# Patient Record
Sex: Male | Born: 1955 | Race: Black or African American | Hispanic: No | Marital: Married | Smoking: Never smoker
Health system: Southern US, Community
[De-identification: ages and names within clinical notes are randomized; demographics above are authoritative.]

## PROBLEM LIST (undated history)

## (undated) DIAGNOSIS — N529 Male erectile dysfunction, unspecified: Secondary | ICD-10-CM

## (undated) DIAGNOSIS — I10 Essential (primary) hypertension: Secondary | ICD-10-CM

## (undated) DIAGNOSIS — E785 Hyperlipidemia, unspecified: Secondary | ICD-10-CM

## (undated) DIAGNOSIS — E119 Type 2 diabetes mellitus without complications: Secondary | ICD-10-CM

## (undated) DIAGNOSIS — G510 Bell's palsy: Secondary | ICD-10-CM

## (undated) HISTORY — DX: Bell's palsy: G51.0

## (undated) HISTORY — DX: Male erectile dysfunction, unspecified: N52.9

## (undated) HISTORY — PX: COLONOSCOPY: SHX174

## (undated) HISTORY — DX: Type 2 diabetes mellitus without complications: E11.9

## (undated) HISTORY — DX: Hyperlipidemia, unspecified: E78.5

---

## 2002-03-27 ENCOUNTER — Emergency Department (HOSPITAL_COMMUNITY): Admission: EM | Admit: 2002-03-27 | Discharge: 2002-03-27 | Payer: Self-pay | Admitting: Emergency Medicine

## 2005-02-26 ENCOUNTER — Emergency Department (HOSPITAL_COMMUNITY): Admission: EM | Admit: 2005-02-26 | Discharge: 2005-02-26 | Payer: Self-pay | Admitting: Family Medicine

## 2006-04-26 ENCOUNTER — Emergency Department (HOSPITAL_COMMUNITY): Admission: EM | Admit: 2006-04-26 | Discharge: 2006-04-26 | Payer: Self-pay | Admitting: Emergency Medicine

## 2006-07-17 ENCOUNTER — Ambulatory Visit (HOSPITAL_BASED_OUTPATIENT_CLINIC_OR_DEPARTMENT_OTHER): Admission: RE | Admit: 2006-07-17 | Discharge: 2006-07-17 | Payer: Self-pay | Admitting: Urology

## 2012-12-11 ENCOUNTER — Ambulatory Visit (INDEPENDENT_AMBULATORY_CARE_PROVIDER_SITE_OTHER): Payer: BC Managed Care – PPO | Admitting: Family Medicine

## 2012-12-11 VITALS — BP 164/82 | HR 71 | Temp 98.5°F | Resp 16 | Ht 71.5 in | Wt 214.6 lb

## 2012-12-11 DIAGNOSIS — R1011 Right upper quadrant pain: Secondary | ICD-10-CM

## 2012-12-11 DIAGNOSIS — Z0000 Encounter for general adult medical examination without abnormal findings: Secondary | ICD-10-CM

## 2012-12-11 LAB — LIPID PANEL
Cholesterol: 136 mg/dL (ref 0–200)
HDL: 52 mg/dL (ref >39)
LDL Cholesterol: 71 mg/dL (ref 0–99)
Total CHOL/HDL Ratio: 2.6 Ratio
Triglycerides: 63 mg/dL (ref <150)
VLDL: 13 mg/dL (ref 0–40)

## 2012-12-11 LAB — POCT CBC
Granulocyte percent: 61 %G (ref 37–80)
HCT, POC: 41.9 % — AB (ref 43.5–53.7)
Hemoglobin: 13 g/dL — AB (ref 14.1–18.1)
Lymph, poc: 1.5 (ref 0.6–3.4)
MCH, POC: 27.7 pg (ref 27–31.2)
MCHC: 31 g/dL — AB (ref 31.8–35.4)
MCV: 89.1 fL (ref 80–97)
MID (cbc): 0.4 (ref 0–0.9)
MPV: 7.5 fL (ref 0–99.8)
POC Granulocyte: 2.9 (ref 2–6.9)
POC LYMPH PERCENT: 31.5 %L (ref 10–50)
POC MID %: 7.5 %M (ref 0–12)
Platelet Count, POC: 335 10*3/uL (ref 142–424)
RBC: 4.7 M/uL (ref 4.69–6.13)
RDW, POC: 13.4 %
WBC: 4.7 10*3/uL (ref 4.6–10.2)

## 2012-12-11 LAB — POCT URINALYSIS DIPSTICK
Bilirubin, UA: NEGATIVE
Blood, UA: NEGATIVE
Glucose, UA: NEGATIVE
Ketones, UA: NEGATIVE
Leukocytes, UA: NEGATIVE
Nitrite, UA: NEGATIVE
Protein, UA: NEGATIVE
Spec Grav, UA: 1.025
Urobilinogen, UA: 0.2
pH, UA: 7

## 2012-12-11 LAB — COMPREHENSIVE METABOLIC PANEL
ALT: 18 U/L (ref 0–53)
AST: 14 U/L (ref 0–37)
Albumin: 4.4 g/dL (ref 3.5–5.2)
Alkaline Phosphatase: 80 U/L (ref 39–117)
BUN: 12 mg/dL (ref 6–23)
CO2: 26 mEq/L (ref 19–32)
Calcium: 9.7 mg/dL (ref 8.4–10.5)
Chloride: 104 mEq/L (ref 96–112)
Creat: 0.97 mg/dL (ref 0.50–1.35)
Glucose, Bld: 140 mg/dL — ABNORMAL HIGH (ref 70–99)
Potassium: 4.7 mEq/L (ref 3.5–5.3)
Sodium: 139 mEq/L (ref 135–145)
Total Bilirubin: 0.4 mg/dL (ref 0.3–1.2)
Total Protein: 6.9 g/dL (ref 6.0–8.3)

## 2012-12-11 LAB — PSA: PSA: 0.36 ng/mL (ref <=4.00)

## 2012-12-11 LAB — IFOBT (OCCULT BLOOD): IFOBT: NEGATIVE

## 2012-12-11 LAB — POCT GLYCOSYLATED HEMOGLOBIN (HGB A1C): Hemoglobin A1C: 5.9

## 2012-12-11 NOTE — Progress Notes (Signed)
Patient ID: Devin Novak MRN: [id number], DOB: May 19, 1956 57 y.o. Date of Encounter: 12/11/2012, 8:42 AM  Primary Physician: No primary provider on file.  Chief Complaint: Physical (CPE)  HPI: 57 y.o. y/o male with history noted below here for CPE.  This patient is married and has one son and 2 grandchildren. He works at [employer]. His wife works as a Electrical engineer. He has a history of diabetes. He notes some diaphoresis at night. He takes Chlamydia provided at night as well. Recently had eye exam. Colonoscopy 2009  Patient's been having some intermittent problems with vague abdominal pain several times a week in the right abdomen. He says is not a cramp but it's more of an ache. It has nothing to do with food however. Nausea or vomiting. Is also having no diarrhea.  Review of Systems: Consitutional: No fever, chills, fatigue, night sweats, lymphadenopathy, or weight changes. Eyes: No visual changes, eye redness, or discharge. ENT/Mouth: Ears: No otalgia, tinnitus, hearing loss, discharge. Nose: No congestion, rhinorrhea, sinus pain, or epistaxis. Throat: No sore throat, post nasal drip, or teeth pain. Cardiovascular: No CP, palpitations, diaphoresis, DOE, edema, orthopnea, PND. Respiratory: No cough, hemoptysis, SOB, or wheezing. Gastrointestinal: No anorexia, dysphagia, reflux,  nausea, vomiting, hematemesis, diarrhea, constipation, BRBPR, or melena. Genitourinary: No dysuria, frequency, urgency, hematuria, incontinence, nocturia, decreased urinary stream, discharge, impotence, or testicular pain/masses. Musculoskeletal: No decreased ROM, myalgias, stiffness, joint swelling, or weakness. Skin: No rash, erythema, lesion changes, pain, warmth, jaundice, or pruritis. Neurological: No headache, dizziness, syncope, seizures, tremors, memory loss, coordination problems, or paresthesias. Psychological: No anxiety, depression, hallucinations, SI/HI. Endocrine: No fatigue, polydipsia,  polyphagia, polyuria, or known diabetes. All other systems were reviewed and are otherwise negative.  No past medical history on file.   No past surgical history on file.  Home Meds:  Prior to Admission medications   Medication Sig Start Date End Date Taking? Authorizing Provider  atorvastatin (LIPITOR) 40 MG tablet Take 40 mg by mouth daily.   Yes Historical Provider, MD  glimepiride (AMARYL) 1 MG tablet Take 1 mg by mouth daily before breakfast.   Yes Historical Provider, MD  metFORMIN (GLUCOPHAGE) 500 MG tablet Take 500 mg by mouth 2 (two) times daily with a meal.   Yes Historical Provider, MD  ramipril (ALTACE) 5 MG tablet Take 5 mg by mouth daily.   Yes Historical Provider, MD    Allergies: No Known Allergies  History   Social History  . Marital Status: Single    Spouse Name: N/A    Number of Children: N/A  . Years of Education: N/A   Occupational History  . Not on file.   Social History Main Topics  . Smoking status: Never Smoker   . Smokeless tobacco: Not on file  . Alcohol Use: Not on file  . Drug Use: Not on file  . Sexually Active: Not on file   Other Topics Concern  . Not on file   Social History Narrative  . No narrative on file    No family history on file.  Physical Exam: Blood pressure 164/82, pulse 71, temperature 98.5 F (36.9 C), temperature source Oral, resp. rate 16, height 5' 11.5" (1.816 m), weight 214 lb 9.6 oz (97.342 kg), SpO2 99.00%.  General: Well developed, well nourished, in no acute distress. HEENT: Normocephalic, atraumatic. Conjunctiva pink, sclera non-icteric. Pupils 2 mm constricting to 1 mm, round, regular, and equally reactive to light and accomodation. EOMI. Internal auditory canal clear. TMs with good cone of light  and without pathology. Nasal mucosa pink. Nares are without discharge. No sinus tenderness. Oral mucosa pink. Dentition patient has an upper plate and no active disease in the dentition.Marland Kitchen Pharynx without exudate.      Neck: Supple. Trachea midline. No thyromegaly. Full ROM. No lymphadenopathy. Lungs: Clear to auscultation bilaterally without wheezes, rales, or rhonchi. Breathing is of normal effort and unlabored. Cardiovascular: RRR with S1 S2. No murmurs, rubs, or gallops appreciated. Distal pulses 2+ symmetrically. No carotid or abdominal bruits Abdomen: Soft, non-tender, non-distended with normoactive bowel sounds. No hepatosplenomegaly or masses. No rebound/guarding. No CVA tenderness. Without hernias.  Rectal: No external hemorrhoids or fissures. Rectal vault without masses.  Genitourinary:  uncircumcised male. No penile lesions. Testes descended bilaterally, and smooth without tenderness or masses.  Musculoskeletal: Full range of motion and 5/5 strength throughout. Without swelling, atrophy, tenderness, crepitus, or warmth. Extremities without clubbing, cyanosis, or edema. Calves supple. Skin: Warm and moist without erythema, ecchymosis, wounds, or rash. Neuro: A+Ox3. CN II-XII grossly intact. Moves all extremities spontaneously. Full sensation throughout. Normal gait. DTR 2+ throughout upper and lower extremities. Finger to nose intact. Psych:  Responds to questions appropriately with a normal affect.   Studies: CBC, CMET, Lipid, PSA pending UA:  Results for orders placed in visit on 12/11/12  POCT CBC      Result Value Range   WBC 4.7  4.6 - 10.2 K/uL   Lymph, poc 1.5  0.6 - 3.4   POC LYMPH PERCENT 31.5  10 - 50 %L   MID (cbc) 0.4  0 - 0.9   POC MID % 7.5  0 - 12 %M   POC Granulocyte 2.9  2 - 6.9   Granulocyte percent 61.0  37 - 80 %G   RBC 4.70  4.69 - 6.13 M/uL   Hemoglobin 13.0 (*) 14.1 - 18.1 g/dL   HCT, POC 16.1 (*) 09.6 - 53.7 %   MCV 89.1  80 - 97 fL   MCH, POC 27.7  27 - 31.2 pg   MCHC 31.0 (*) 31.8 - 35.4 g/dL   RDW, POC 04.5     Platelet Count, POC 335  142 - 424 K/uL   MPV 7.5  0 - 99.8 fL  POCT GLYCOSYLATED HEMOGLOBIN (HGB A1C)      Result Value Range   Hemoglobin A1C 5.9     POCT URINALYSIS DIPSTICK      Result Value Range   Color, UA yellow     Clarity, UA clear     Glucose, UA neg     Bilirubin, UA neg     Ketones, UA neg     Spec Grav, UA 1.025     Blood, UA neg     pH, UA 7.0     Protein, UA neg     Urobilinogen, UA 0.2     Nitrite, UA neg     Leukocytes, UA Negative    IFOBT (OCCULT BLOOD)      Result Value Range   IFOBT Negative       Assessment/Plan:  57 y.o. y/o  male here for CPE Routine general medical examination at a health care facility - Plan: POCT CBC, POCT glycosylated hemoglobin (Hb A1C), POCT urinalysis dipstick, Comprehensive metabolic panel, PSA, Lipid panel, EKG 12-Lead, US Abdomen Complete  Signed, Elvina Sidle, MD 12/11/2012 8:42 AM

## 2012-12-25 ENCOUNTER — Other Ambulatory Visit: Payer: Self-pay

## 2013-01-10 ENCOUNTER — Other Ambulatory Visit: Payer: Self-pay

## 2013-01-17 ENCOUNTER — Ambulatory Visit
Admission: RE | Admit: 2013-01-17 | Discharge: 2013-01-17 | Disposition: A | Discharge status: Home / Self Care | Payer: BC Managed Care – PPO | Source: Ambulatory Visit | Attending: Family Medicine | Admitting: Family Medicine

## 2013-01-17 DIAGNOSIS — Z0000 Encounter for general adult medical examination without abnormal findings: Secondary | ICD-10-CM

## 2013-06-11 ENCOUNTER — Ambulatory Visit
Admission: RE | Admit: 2013-06-11 | Discharge: 2013-06-11 | Disposition: A | Discharge status: Home / Self Care | Payer: BC Managed Care – PPO | Source: Ambulatory Visit | Attending: Family Medicine | Admitting: Family Medicine

## 2013-06-11 ENCOUNTER — Other Ambulatory Visit: Payer: Self-pay | Admitting: Family Medicine

## 2013-06-11 DIAGNOSIS — R634 Abnormal weight loss: Secondary | ICD-10-CM

## 2015-06-04 ENCOUNTER — Ambulatory Visit (INDEPENDENT_AMBULATORY_CARE_PROVIDER_SITE_OTHER): Payer: Self-pay | Admitting: Family Medicine

## 2015-06-04 VITALS — BP 118/74 | HR 96 | Temp 97.6°F | Resp 16 | Ht 71.5 in | Wt 212.2 lb

## 2015-06-04 DIAGNOSIS — E785 Hyperlipidemia, unspecified: Secondary | ICD-10-CM | POA: Insufficient documentation

## 2015-06-04 DIAGNOSIS — E1165 Type 2 diabetes mellitus with hyperglycemia: Secondary | ICD-10-CM

## 2015-06-04 DIAGNOSIS — IMO0001 Reserved for inherently not codable concepts without codable children: Secondary | ICD-10-CM

## 2015-06-04 DIAGNOSIS — Z794 Long term (current) use of insulin: Secondary | ICD-10-CM

## 2015-06-04 DIAGNOSIS — E119 Type 2 diabetes mellitus without complications: Secondary | ICD-10-CM | POA: Insufficient documentation

## 2015-06-04 LAB — COMPREHENSIVE METABOLIC PANEL
ALT: 13 U/L (ref 9–46)
AST: 11 U/L (ref 10–35)
Albumin: 4.5 g/dL (ref 3.6–5.1)
Alkaline Phosphatase: 109 U/L (ref 40–115)
BUN: 17 mg/dL (ref 7–25)
CHLORIDE: 97 mmol/L — AB (ref 98–110)
CO2: 25 mmol/L (ref 20–31)
CREATININE: 1.1 mg/dL (ref 0.70–1.33)
Calcium: 9.4 mg/dL (ref 8.6–10.3)
GLUCOSE: 323 mg/dL — AB (ref 65–99)
POTASSIUM: 4.6 mmol/L (ref 3.5–5.3)
SODIUM: 132 mmol/L — AB (ref 135–146)
TOTAL PROTEIN: 7.2 g/dL (ref 6.1–8.1)
Total Bilirubin: 0.5 mg/dL (ref 0.2–1.2)

## 2015-06-04 LAB — LIPID PANEL
CHOL/HDL RATIO: 3.7 ratio (ref <=5.0)
CHOLESTEROL: 134 mg/dL (ref 125–200)
HDL: 36 mg/dL — ABNORMAL LOW (ref >=40)
LDL CALC: 71 mg/dL (ref <130)
Triglycerides: 134 mg/dL (ref <150)
VLDL: 27 mg/dL (ref <30)

## 2015-06-04 LAB — GLUCOSE, POCT (MANUAL RESULT ENTRY): POC GLUCOSE: 337 mg/dL — AB (ref 70–99)

## 2015-06-04 LAB — POCT GLYCOSYLATED HEMOGLOBIN (HGB A1C): HEMOGLOBIN A1C: 11.7

## 2015-06-04 MED ORDER — GLIMEPIRIDE 1 MG PO TABS
1.0000 mg | ORAL_TABLET | Freq: Every day | ORAL | Status: DC
Start: 2015-06-04 — End: 2015-06-11

## 2015-06-04 MED ORDER — INSULIN GLARGINE 100 UNIT/ML ~~LOC~~ SOLN
10.0000 [IU] | Freq: Once | SUBCUTANEOUS | Status: AC
  Administered 2015-06-04: 10 [IU] via SUBCUTANEOUS

## 2015-06-04 MED ORDER — METFORMIN HCL 1000 MG PO TABS: 1000.0000 mg | ORAL_TABLET | Freq: Two times a day (BID) | ORAL | Status: DC

## 2015-06-04 MED ORDER — RAMIPRIL 5 MG PO CAPS: 5.0000 mg | ORAL_CAPSULE | Freq: Every day | ORAL | Status: DC

## 2015-06-04 NOTE — Progress Notes (Signed)
[facility] [address], [place] [id number] [phone]-[phone]  Date:  06/04/2015   Name:  Devin Novak   DOB:  03-Feb-1956   MRN:  [id number]  PCP:  No primary care provider on file.    Chief Complaint: Blood Sugar Problem and Flu Vaccine   History of Present Illness:  Devin Novak is a 59 y.o. very pleasant male patient who presents with the following:  History of DM- last seen here in 2014. He has been taking metformin, atrovastatin and ramipril but not the amaryl recently He is taking metformin 500 #3 daily.    He is currently in-between jobs and does not have insurnace.  His last A1c was over a year ago  His sugars have been over 300 for a couple of months. He has been feeling tired, thirsty, increased urination.  He may have lost a few lbs but not many.  He is not acutely ill but thought he should get looked at.  He is fasting today  Lab Results  Component Value Date   HGBA1C 5.9 12/11/2012     There are no active problems to display for this patient.   Past Medical History  Diagnosis Date  . Diabetes mellitus without complication (HCC)     History reviewed. No pertinent past surgical history.  Social History  Substance Use Topics  . Smoking status: Never Smoker   . Smokeless tobacco: None  . Alcohol Use: None    Family History  Problem Relation Age of Onset  . Cancer Mother   . Diabetes Mother     No Known Allergies  Medication list has been reviewed and updated.  Current Outpatient Prescriptions on File Prior to Visit  Medication Sig Dispense Refill  . atorvastatin (LIPITOR) 40 MG tablet Take 40 mg by mouth daily.    Marland Kitchen. glimepiride (AMARYL) 1 MG tablet Take 1 mg by mouth daily before breakfast.    . metFORMIN (GLUCOPHAGE) 500 MG tablet Take 500 mg by mouth 2 (two) times daily with a meal.    . ramipril (ALTACE) 5 MG tablet Take 5 mg by mouth daily.     No current facility-administered medications on file prior to  visit.    Review of Systems:  As per HPI- otherwise negative.   Physical Examination: Filed Vitals:   06/04/15 0819  BP: 118/74  Pulse: 96  Temp: 97.6 F (36.4 C)  Resp: 16   Filed Vitals:   06/04/15 0819  Height: 5' 11.5" (1.816 m)  Weight: 212 lb 3.2 oz (96.253 kg)   Body mass index is 29.19 kg/(m^2). Ideal Body Weight: Weight in (lb) to have BMI = 25: 181.4  GEN: WDWN, NAD, Non-toxic, A & O x 3, overweight, looks well HEENT: Atraumatic, Normocephalic. Neck supple. No masses, No LAD. Ears and Nose: No external deformity. CV: RRR, No M/G/R. No JVD. No thrill. No extra heart sounds. PULM: CTA B, no wheezes, crackles, rhonchi. No retractions. No resp. distress. No accessory muscle use. EXTR: No c/c/e NEURO Normal gait.  PSYCH: Normally interactive. Conversant. Not depressed or anxious appearing.  Calm demeanor.   Results for orders placed or performed in visit on 06/04/15  Comprehensive metabolic panel  Result Value Ref Range   Sodium 132 (L) 135 - 146 mmol/L   Potassium 4.6 3.5 - 5.3 mmol/L   Chloride 97 (L) 98 - 110 mmol/L   CO2 25 20 - 31 mmol/L   Glucose, Bld 323 (H) 65 - 99 mg/dL  BUN 17 7 - 25 mg/dL   Creat 1.61 0.96 - 0.45 mg/dL   Total Bilirubin 0.5 0.2 - 1.2 mg/dL   Alkaline Phosphatase 109 40 - 115 U/L   AST 11 10 - 35 U/L   ALT 13 9 - 46 U/L   Total Protein 7.2 6.1 - 8.1 g/dL   Albumin 4.5 3.6 - 5.1 g/dL   Calcium 9.4 8.6 - 40.9 mg/dL  Lipid panel  Result Value Ref Range   Cholesterol 134 125 - 200 mg/dL   Triglycerides 811 <914 mg/dL   HDL 36 (L) >=78 mg/dL   Total CHOL/HDL Ratio 3.7 <=5.0 Ratio   VLDL 27 <30 mg/dL   LDL Cholesterol 71 <295 mg/dL  POCT glucose (manual entry)  Result Value Ref Range   POC Glucose 337 (A) 70 - 99 mg/dl  POCT glycosylated hemoglobin (Hb A1C)  Result Value Ref Range   Hemoglobin A1C 11.7      Assessment and Plan: Uncontrolled type 2 diabetes mellitus without complication, with long-term current use of  insulin (HCC) - Plan: POCT glucose (manual entry), POCT glycosylated hemoglobin (Hb A1C), Comprehensive metabolic panel, Lipid panel, insulin glargine (LANTUS) injection 10 Units, metFORMIN (GLUCOPHAGE) 1000 MG tablet, glimepiride (AMARYL) 1 MG tablet  Dyslipidemia  Here today to discuss diabetes and get back on his medications. Noted elevated A1c- will increase his metformin to 2,000 daily and also added back his amaryl.  Gave 10u of lantus here and a solostar pen to take home, did teaching with pt.    Received the rest of his labs- Na:135.6 Anion gap: 10  He does not show signs of acidosis.  Called to let him know.  Increase oral meds as above and will check in with him in a couple of days.  If glucose towards 400 he will call me    Signed Abbe Amsterdam, MD

## 2015-06-04 NOTE — Patient Instructions (Signed)
Your diabetes has gotten quite out of control We are going to increase your metformin to 1,000 mg twice a day and also restart your glimepiride 1mg  a day.  You got 10 units of insulin today and a pen to take home in case we need to use this for a while Work on drinking plenty of water and watch your sugar/ carb intake.  Please check out the american diabetes association website for lots of good information I will call you with your labs asap- please see us in one week to check in

## 2015-06-05 ENCOUNTER — Encounter: Payer: Self-pay | Admitting: Family Medicine

## 2015-06-11 ENCOUNTER — Telehealth: Payer: Self-pay | Admitting: Family Medicine

## 2015-06-11 DIAGNOSIS — IMO0001 Reserved for inherently not codable concepts without codable children: Secondary | ICD-10-CM

## 2015-06-11 DIAGNOSIS — E1165 Type 2 diabetes mellitus with hyperglycemia: Principal | ICD-10-CM

## 2015-06-11 DIAGNOSIS — Z794 Long term (current) use of insulin: Principal | ICD-10-CM

## 2015-06-11 MED ORDER — GLIMEPIRIDE 2 MG PO TABS: 2.0000 mg | ORAL_TABLET | Freq: Every day | ORAL | Status: DC

## 2015-06-11 NOTE — Telephone Encounter (Signed)
Called to check on him- he reports that his sugars are running about 250 which is better.  We will increase his glimepiride to 2 mg and he will come in for a recheck in 2-3 weeks

## 2015-07-13 ENCOUNTER — Telehealth: Payer: Self-pay

## 2015-07-13 NOTE — Telephone Encounter (Signed)
Pt is needing a refill on his cholesteral medication he could not remember the name but it starts with an A and is needing to make ramaphil in generic  Best number [phone]

## 2015-07-13 NOTE — Telephone Encounter (Signed)
Rampril is generic.

## 2015-07-14 MED ORDER — ATORVASTATIN CALCIUM 40 MG PO TABS: 40.0000 mg | ORAL_TABLET | Freq: Every day | ORAL | Status: DC

## 2015-07-14 NOTE — Telephone Encounter (Signed)
Sent in Rx for atorvastatin, and Dr Patsy Lager had sent in Ramipril (generic) #90 w/ 3 RFs on 06/04/15 to [facility] on [address]. Please notify pt and see if he needs anything else.

## 2015-07-14 NOTE — Telephone Encounter (Signed)
Patient called back and relayed the information to patient.  He will pick up scripts at [facility].

## 2015-07-14 NOTE — Telephone Encounter (Signed)
Unable to reach Pt. Left VM to call back, please see previous phone message

## 2015-08-03 ENCOUNTER — Telehealth: Payer: Self-pay

## 2015-08-03 NOTE — Telephone Encounter (Signed)
Pt is needing to talk with someone about refilling his ramipril

## 2015-08-04 ENCOUNTER — Other Ambulatory Visit: Payer: Self-pay | Admitting: *Deleted

## 2015-08-04 DIAGNOSIS — E1165 Type 2 diabetes mellitus with hyperglycemia: Principal | ICD-10-CM

## 2015-08-04 DIAGNOSIS — IMO0001 Reserved for inherently not codable concepts without codable children: Secondary | ICD-10-CM

## 2015-08-04 DIAGNOSIS — Z794 Long term (current) use of insulin: Principal | ICD-10-CM

## 2015-08-04 MED ORDER — RAMIPRIL 5 MG PO CAPS: 5.0000 mg | ORAL_CAPSULE | Freq: Every day | ORAL | Status: DC

## 2015-08-04 NOTE — Telephone Encounter (Signed)
Follow up appointment needed for refills

## 2015-10-16 ENCOUNTER — Other Ambulatory Visit: Payer: Self-pay | Admitting: Family Medicine

## 2015-10-22 ENCOUNTER — Ambulatory Visit (INDEPENDENT_AMBULATORY_CARE_PROVIDER_SITE_OTHER): Payer: Commercial Managed Care - PPO | Admitting: Family Medicine

## 2015-10-22 VITALS — BP 122/80 | HR 74 | Temp 98.2°F | Resp 18 | Ht 71.5 in | Wt 215.0 lb

## 2015-10-22 DIAGNOSIS — R6882 Decreased libido: Secondary | ICD-10-CM | POA: Diagnosis not present

## 2015-10-22 DIAGNOSIS — E119 Type 2 diabetes mellitus without complications: Secondary | ICD-10-CM | POA: Diagnosis not present

## 2015-10-22 DIAGNOSIS — G4700 Insomnia, unspecified: Secondary | ICD-10-CM

## 2015-10-22 DIAGNOSIS — E7800 Pure hypercholesterolemia, unspecified: Secondary | ICD-10-CM

## 2015-10-22 DIAGNOSIS — E1165 Type 2 diabetes mellitus with hyperglycemia: Secondary | ICD-10-CM

## 2015-10-22 DIAGNOSIS — Z794 Long term (current) use of insulin: Secondary | ICD-10-CM

## 2015-10-22 DIAGNOSIS — Z1159 Encounter for screening for other viral diseases: Secondary | ICD-10-CM

## 2015-10-22 DIAGNOSIS — IMO0001 Reserved for inherently not codable concepts without codable children: Secondary | ICD-10-CM

## 2015-10-22 DIAGNOSIS — Z23 Encounter for immunization: Secondary | ICD-10-CM | POA: Diagnosis not present

## 2015-10-22 LAB — TSH: TSH: 2.01 m[IU]/L (ref 0.40–4.50)

## 2015-10-22 LAB — CBC WITH DIFFERENTIAL/PLATELET
BASOS PCT: 0 %
Basophils Absolute: 0 cells/uL (ref 0–200)
EOS PCT: 3 %
Eosinophils Absolute: 201 cells/uL (ref 15–500)
HEMATOCRIT: 41.9 % (ref 38.5–50.0)
Hemoglobin: 13.5 g/dL (ref 13.2–17.1)
LYMPHS PCT: 30 %
Lymphs Abs: 2010 cells/uL (ref 850–3900)
MCH: 28.6 pg (ref 27.0–33.0)
MCHC: 32.2 g/dL (ref 32.0–36.0)
MCV: 88.8 fL (ref 80.0–100.0)
MONOS PCT: 7 %
MPV: 9.2 fL (ref 7.5–12.5)
Monocytes Absolute: 469 cells/uL (ref 200–950)
NEUTROS PCT: 60 %
Neutro Abs: 4020 cells/uL (ref 1500–7800)
PLATELETS: 420 10*3/uL — AB (ref 140–400)
RBC: 4.72 MIL/uL (ref 4.20–5.80)
RDW: 13.5 % (ref 11.0–15.0)
WBC: 6.7 10*3/uL (ref 3.8–10.8)

## 2015-10-22 LAB — COMPREHENSIVE METABOLIC PANEL
ALT: 15 U/L (ref 9–46)
AST: 13 U/L (ref 10–35)
Albumin: 4.3 g/dL (ref 3.6–5.1)
Alkaline Phosphatase: 103 U/L (ref 40–115)
BILIRUBIN TOTAL: 0.5 mg/dL (ref 0.2–1.2)
BUN: 12 mg/dL (ref 7–25)
CALCIUM: 9.3 mg/dL (ref 8.6–10.3)
CO2: 26 mmol/L (ref 20–31)
Chloride: 104 mmol/L (ref 98–110)
Creat: 1.08 mg/dL (ref 0.70–1.33)
GLUCOSE: 134 mg/dL — AB (ref 65–99)
Potassium: 4.7 mmol/L (ref 3.5–5.3)
SODIUM: 141 mmol/L (ref 135–146)
Total Protein: 7.1 g/dL (ref 6.1–8.1)

## 2015-10-22 LAB — MICROALBUMIN, URINE: MICROALB UR: 1.2 mg/dL

## 2015-10-22 LAB — POCT URINALYSIS DIP (MANUAL ENTRY)
Bilirubin, UA: NEGATIVE
GLUCOSE UA: NEGATIVE
Ketones, POC UA: NEGATIVE
Leukocytes, UA: NEGATIVE
NITRITE UA: NEGATIVE
PH UA: 5
PROTEIN UA: NEGATIVE
RBC UA: NEGATIVE
SPEC GRAV UA: 1.02
UROBILINOGEN UA: 0.2

## 2015-10-22 LAB — LIPID PANEL
Cholesterol: 110 mg/dL — ABNORMAL LOW (ref 125–200)
HDL: 49 mg/dL (ref >=40)
LDL CALC: 46 mg/dL (ref <130)
Total CHOL/HDL Ratio: 2.2 Ratio (ref <=5.0)
Triglycerides: 76 mg/dL (ref <150)
VLDL: 15 mg/dL (ref <30)

## 2015-10-22 LAB — GLUCOSE, POCT (MANUAL RESULT ENTRY): POC GLUCOSE: 152 mg/dL — AB (ref 70–99)

## 2015-10-22 LAB — POCT GLYCOSYLATED HEMOGLOBIN (HGB A1C): Hemoglobin A1C: 6.6

## 2015-10-22 MED ORDER — METFORMIN HCL 1000 MG PO TABS: 1000.0000 mg | ORAL_TABLET | Freq: Two times a day (BID) | ORAL | Status: DC

## 2015-10-22 MED ORDER — RAMIPRIL 5 MG PO CAPS: 5.0000 mg | ORAL_CAPSULE | Freq: Every day | ORAL | Status: DC

## 2015-10-22 MED ORDER — ATORVASTATIN CALCIUM 40 MG PO TABS: 40.0000 mg | ORAL_TABLET | Freq: Every day | ORAL | Status: DC

## 2015-10-22 MED ORDER — GLIMEPIRIDE 2 MG PO TABS: ORAL_TABLET | ORAL | Status: DC

## 2015-10-22 NOTE — Progress Notes (Addendum)
Subjective:    Patient ID: Devin Novak, male    DOB: Aug 24, 1955, 60 y.o.   MRN: [id number]  10/22/2015  Follow-up; Diabetes; Medication Refill; and Insomnia   HPI This 60 y.o. male presents for five month follow-up for the following:   1.  DMII: meter broke; not checking sugars for past month; sugars running 220 fasting.  Taking Metformin 1000mg  bid.  Taking Glimepiride 2mg  one at bedtime.  Has lost a lot of weight; did weigh 240.   Last eye exam this month; [facility].    2.  Dyslipidemia: taking Lipitor 40mg  qhs.  3.  HTN: taking Ramipiril qhs.    4. Mother with colon cancer: last colonoscopy seven years ago.  [facility].    5. Decreased libido:  Onset in past year; marriage is good.  No problems with erections. Wife worried about another relationship.  No desire; prefers to sit on couch and watch television.  Tired.  Sleeping 5-6 hours per night; wakes up 2 hours prior to time to wake up.  No snoring.  No apnea.   PCP: [facility] over one year ago.     Review of Systems  Constitutional: Negative for fever, chills, diaphoresis, activity change, appetite change and fatigue.  Respiratory: Negative for cough and shortness of breath.   Cardiovascular: Negative for chest pain, palpitations and leg swelling.  Gastrointestinal: Negative for nausea, vomiting, abdominal pain and diarrhea.  Endocrine: Negative for cold intolerance, heat intolerance, polydipsia, polyphagia and polyuria.  Skin: Negative for color change, rash and wound.  Neurological: Negative for dizziness, tremors, seizures, syncope, facial asymmetry, speech difficulty, weakness, light-headedness, numbness and headaches.  Psychiatric/Behavioral: Negative for sleep disturbance and dysphoric mood. The patient is not nervous/anxious.     Past Medical History  Diagnosis Date  . Diabetes mellitus without complication (HCC)   . Hyperlipidemia    History reviewed. No pertinent past surgical history. No Known  Allergies Current Outpatient Prescriptions  Medication Sig Dispense Refill  . atorvastatin (LIPITOR) 40 MG tablet Take 1 tablet (40 mg total) by mouth daily. 90 tablet 1  . glimepiride (AMARYL) 2 MG tablet TAKE ONE TABLET BY MOUTH ONCE DAILY BEFORE BREAKFAST 90 tablet 1  . metFORMIN (GLUCOPHAGE) 1000 MG tablet Take 1 tablet (1,000 mg total) by mouth 2 (two) times daily with a meal. 180 tablet 1  . ramipril (ALTACE) 5 MG capsule Take 1 capsule (5 mg total) by mouth daily. 90 capsule 1   No current facility-administered medications for this visit.   Social History   Social History  . Marital Status: Single    Spouse Name: N/A  . Number of Children: N/A  . Years of Education: N/A   Occupational History  . Not on file.   Social History Main Topics  . Smoking status: Never Smoker   . Smokeless tobacco: Current User    Types: Chew  . Alcohol Use: Not on file  . Drug Use: Not on file  . Sexual Activity: Not on file   Other Topics Concern  . Not on file   Social History Narrative   Marital status: married x 3 years     Children:  1 son; 4 grandchildren in area      Lives: with wife      Employment:  Banker; worked for [employer] for 30 years      Tobacco:  None      Alcohol: beer 2 per day      Exercise:  none  Family History  Problem Relation Age of Onset  . Cancer Mother 1655    colon cancer  . Diabetes Mother        Objective:    BP 122/80 mmHg  Pulse 74  Temp(Src) 98.2 F (36.8 C) (Oral)  Resp 18  Ht 5' 11.5" (1.816 m)  Wt 215 lb (97.523 kg)  BMI 29.57 kg/m2  SpO2 98% Physical Exam  Constitutional: He is oriented to person, place, and time. He appears well-developed and well-nourished. No distress.  HENT:  Head: Normocephalic and atraumatic.  Right Ear: External ear normal.  Left Ear: External ear normal.  Nose: Nose normal.  Mouth/Throat: Oropharynx is clear and moist.  Eyes: Conjunctivae and EOM are normal. Pupils are equal, round,  and reactive to light.  Neck: Normal range of motion. Neck supple. Carotid bruit is not present. No thyromegaly present.  Cardiovascular: Normal rate, regular rhythm, normal heart sounds and intact distal pulses.  Exam reveals no gallop and no friction rub.   No murmur heard. Pulmonary/Chest: Effort normal and breath sounds normal. He has no wheezes. He has no rales.  Abdominal: Soft. Bowel sounds are normal. He exhibits no distension and no mass. There is no tenderness. There is no rebound and no guarding.  Lymphadenopathy:    He has no cervical adenopathy.  Neurological: He is alert and oriented to person, place, and time. No cranial nerve deficit.  Skin: Skin is warm and dry. No rash noted. He is not diaphoretic.  Calluses B feet.  Psychiatric: He has a normal mood and affect. His behavior is normal.  Nursing note and vitals reviewed.  Results for orders placed or performed in visit on 10/22/15  POCT glucose (manual entry)  Result Value Ref Range   POC Glucose 152 (A) 70 - 99 mg/dl  POCT glycosylated hemoglobin (Hb A1C)  Result Value Ref Range   Hemoglobin A1C 6.6   POCT urinalysis dipstick  Result Value Ref Range   Color, UA yellow yellow   Clarity, UA clear clear   Glucose, UA negative negative   Bilirubin, UA negative negative   Ketones, POC UA negative negative   Spec Grav, UA 1.020    Blood, UA negative negative   pH, UA 5.0    Protein Ur, POC negative negative   Urobilinogen, UA 0.2    Nitrite, UA Negative Negative   Leukocytes, UA Negative Negative       Assessment & Plan:   1. Type 2 diabetes mellitus without complication, without long-term current use of insulin (HCC)   2. Pure hypercholesterolemia   3. Need for Tdap vaccination   4. Need for prophylactic vaccination against Streptococcus pneumoniae (pneumococcus)   5. Need for hepatitis C screening test   6. Insomnia   7. Decreased libido   8. Uncontrolled type 2 diabetes mellitus without complication, with  long-term current use of insulin (HCC)    -Controlled. -refills provided. -obtain labs. -RTC 3-6 months. -s/p TDAP and Pneumovax. -pt declined referral for repeat colonoscopy; to [facility]. -obtain testosterone level due to decreased libido. -consider sleep aide such as Melatonin and Unisom.  If no improvement in fatigue, consider sleep study.   Orders Placed This Encounter  Procedures  . Tdap vaccine greater than or equal to 7yo IM  . Pneumococcal polysaccharide vaccine 23-valent greater than or equal to 2yo subcutaneous/IM  . CBC with Differential/Platelet  . Comprehensive metabolic panel    Order Specific Question:  Has the patient fasted?  Answer:  Yes  . Lipid panel    Order Specific Question:  Has the patient fasted?    Answer:  Yes  . TSH  . Microalbumin, urine  . Hepatitis C antibody  . Testosterone  . POCT glucose (manual entry)  . POCT glycosylated hemoglobin (Hb A1C)  . POCT urinalysis dipstick   Meds ordered this encounter  Medications  . ramipril (ALTACE) 5 MG capsule    Sig: Take 1 capsule (5 mg total) by mouth daily.    Dispense:  90 capsule    Refill:  1  . metFORMIN (GLUCOPHAGE) 1000 MG tablet    Sig: Take 1 tablet (1,000 mg total) by mouth 2 (two) times daily with a meal.    Dispense:  180 tablet    Refill:  1  . glimepiride (AMARYL) 2 MG tablet    Sig: TAKE ONE TABLET BY MOUTH ONCE DAILY BEFORE BREAKFAST    Dispense:  90 tablet    Refill:  1  . atorvastatin (LIPITOR) 40 MG tablet    Sig: Take 1 tablet (40 mg total) by mouth daily.    Dispense:  90 tablet    Refill:  1    Return in about 6 months (around 04/23/2016) for recheck diabetes, high cholesterol.    Nyia Tsao Paulita Fujita, M.D. [facility] [address] [place], [place]  [address] [phone] phone [phone] fax

## 2015-10-22 NOTE — Patient Instructions (Addendum)
1.  START ASPIRIN 81MG  ONE TABLET DAILY FOR HEART ATTACK AND STROKE PREVENTION.   Basic Carbohydrate Counting for Diabetes Mellitus Carbohydrate counting is a method for keeping track of the amount of carbohydrates you eat. Eating carbohydrates naturally increases the level of sugar (glucose) in your blood, so it is important for you to know the amount that is okay for you to have in every meal. Carbohydrate counting helps keep the level of glucose in your blood within normal limits. The amount of carbohydrates allowed is different for every person. A dietitian can help you calculate the amount that is right for you. Once you know the amount of carbohydrates you can have, you can count the carbohydrates in the foods you want to eat. Carbohydrates are found in the following foods:  Grains, such as breads and cereals.  Dried beans and soy products.  Starchy vegetables, such as potatoes, peas, and corn.  Fruit and fruit juices.  Milk and yogurt.  Sweets and snack foods, such as cake, cookies, candy, chips, soft drinks, and fruit drinks. CARBOHYDRATE COUNTING There are two ways to count the carbohydrates in your food. You can use either of the methods or a combination of both. Reading the "Nutrition Facts" on Packaged Food The "Nutrition Facts" is an area that is included on the labels of almost all packaged food and beverages in the [place]. It includes the serving size of that food or beverage and information about the nutrients in each serving of the food, including the grams (g) of carbohydrate per serving.  Decide the number of servings of this food or beverage that you will be able to eat or drink. Multiply that number of servings by the number of grams of carbohydrate that is listed on the label for that serving. The total will be the amount of carbohydrates you will be having when you eat or drink this food or beverage. Learning Standard Serving Sizes of Food When you eat food that  is not packaged or does not include "Nutrition Facts" on the label, you need to measure the servings in order to count the amount of carbohydrates.A serving of most carbohydrate-rich foods contains about 15 g of carbohydrates. The following list includes serving sizes of carbohydrate-rich foods that provide 15 g ofcarbohydrate per serving:   1 slice of bread (1 oz) or 1 six-inch tortilla.    of a hamburger bun or English muffin.  4-6 crackers.   cup unsweetened dry cereal.    cup hot cereal.   cup rice or pasta.    cup mashed potatoes or  of a large baked potato.  1 cup fresh fruit or one small piece of fruit.    cup canned or frozen fruit or fruit juice.  1 cup milk.   cup plain fat-free yogurt or yogurt sweetened with artificial sweeteners.   cup cooked dried beans or starchy vegetable, such as peas, corn, or potatoes.  Decide the number of standard-size servings that you will eat. Multiply that number of servings by 15 (the grams of carbohydrates in that serving). For example, if you eat 2 cups of strawberries, you will have eaten 2 servings and 30 g of carbohydrates (2 servings x 15 g = 30 g). For foods such as soups and casseroles, in which more than one food is mixed in, you will need to count the carbohydrates in each food that is included. EXAMPLE OF CARBOHYDRATE COUNTING Sample Dinner  3 oz chicken breast.   cup of brown  rice.   cup of corn.  1 cup milk.   1 cup strawberries with sugar-free whipped topping.  Carbohydrate Calculation Step 1: Identify the foods that contain carbohydrates:   Rice.   Corn.   Milk.   Strawberries. Step 2:Calculate the number of servings eaten of each:   2 servings of rice.   1 serving of corn.   1 serving of milk.   1 serving of strawberries. Step 3: Multiply each of those number of servings by 15 g:   2 servings of rice x 15 g = 30 g.   1 serving of corn x 15 g = 15 g.   1 serving of  milk x 15 g = 15 g.   1 serving of strawberries x 15 g = 15 g. Step 4: Add together all of the amounts to find the total grams of carbohydrates eaten: 30 g + 15 g + 15 g + 15 g = 75 g.   This information is not intended to replace advice given to you by your health care provider. Make sure you discuss any questions you have with your health care provider.   Document Released: 05/30/2005 Document Revised: 06/20/2014 Document Reviewed: 04/26/2013 Elsevier Interactive Patient Education 2016 [employer].     IF you received an x-ray today, you will receive an invoice from [facility]. Please contact [facility] at [phone] with questions or concerns regarding your invoice.   IF you received labwork today, you will receive an invoice from [facility]. Please contact [facility] at [phone] with questions or concerns regarding your invoice.   Our billing staff will not be able to assist you with questions regarding bills from these companies.  You will be contacted with the lab results as soon as they are available. The fastest way to get your results is to activate your My Chart account. Instructions are located on the last page of this paperwork. If you have not heard from [place] regarding the results in 2 weeks, please contact this office.

## 2015-10-23 LAB — HEPATITIS C ANTIBODY: HCV Ab: NEGATIVE

## 2015-10-23 LAB — TESTOSTERONE: Testosterone: 264 ng/dL (ref 250–827)

## 2015-11-15 ENCOUNTER — Encounter: Payer: Self-pay | Admitting: Family Medicine

## 2016-02-04 ENCOUNTER — Ambulatory Visit (INDEPENDENT_AMBULATORY_CARE_PROVIDER_SITE_OTHER): Payer: Commercial Managed Care - PPO | Admitting: Physician Assistant

## 2016-02-04 VITALS — BP 130/76 | HR 70 | Temp 98.0°F | Resp 18 | Ht 71.25 in | Wt 210.2 lb

## 2016-02-04 DIAGNOSIS — E119 Type 2 diabetes mellitus without complications: Secondary | ICD-10-CM | POA: Diagnosis not present

## 2016-02-04 DIAGNOSIS — Z1211 Encounter for screening for malignant neoplasm of colon: Secondary | ICD-10-CM | POA: Diagnosis not present

## 2016-02-04 DIAGNOSIS — Z794 Long term (current) use of insulin: Secondary | ICD-10-CM | POA: Diagnosis not present

## 2016-02-04 DIAGNOSIS — Z0000 Encounter for general adult medical examination without abnormal findings: Secondary | ICD-10-CM | POA: Diagnosis not present

## 2016-02-04 DIAGNOSIS — R350 Frequency of micturition: Secondary | ICD-10-CM

## 2016-02-04 LAB — COMPREHENSIVE METABOLIC PANEL
AST: 11 U/L (ref 10–35)
BUN: 15 mg/dL (ref 7–25)
CO2: 25 mmol/L (ref 20–31)
Chloride: 105 mmol/L (ref 98–110)
Glucose, Bld: 103 mg/dL — ABNORMAL HIGH (ref 65–99)
Sodium: 139 mmol/L (ref 135–146)

## 2016-02-04 LAB — COMPREHENSIVE METABOLIC PANEL WITH GFR
ALT: 14 U/L (ref 9–46)
Albumin: 4.5 g/dL (ref 3.6–5.1)
Alkaline Phosphatase: 86 U/L (ref 40–115)
Calcium: 9.6 mg/dL (ref 8.6–10.3)
Creat: 0.99 mg/dL (ref 0.70–1.33)
Potassium: 4.8 mmol/L (ref 3.5–5.3)
Total Bilirubin: 0.5 mg/dL (ref 0.2–1.2)
Total Protein: 7.1 g/dL (ref 6.1–8.1)

## 2016-02-04 LAB — LIPID PANEL
Cholesterol: 111 mg/dL — ABNORMAL LOW (ref 125–200)
HDL: 46 mg/dL (ref >=40)
LDL Cholesterol: 55 mg/dL (ref <130)
Total CHOL/HDL Ratio: 2.4 Ratio (ref <=5.0)
Triglycerides: 50 mg/dL (ref <150)
VLDL: 10 mg/dL (ref <30)

## 2016-02-04 LAB — POCT URINALYSIS DIP (MANUAL ENTRY)
Bilirubin, UA: NEGATIVE
Blood, UA: NEGATIVE
Glucose, UA: NEGATIVE
Ketones, POC UA: NEGATIVE
Leukocytes, UA: NEGATIVE
Nitrite, UA: NEGATIVE
Protein Ur, POC: NEGATIVE
Spec Grav, UA: 1.02
Urobilinogen, UA: 1
pH, UA: 5.5

## 2016-02-04 LAB — POCT CBC
Granulocyte percent: 62 %G (ref 37–80)
HCT, POC: 38.9 % — AB (ref 43.5–53.7)
Hemoglobin: 13.4 g/dL — AB (ref 14.1–18.1)
Lymph, poc: 2.1 (ref 0.6–3.4)
MCH, POC: 28.9 pg (ref 27–31.2)
MCHC: 34.6 g/dL (ref 31.8–35.4)
MCV: 83.7 fL (ref 80–97)
MID (cbc): 0.2 (ref 0–0.9)
MPV: 6.8 fL (ref 0–99.8)
POC Granulocyte: 3.8 (ref 2–6.9)
POC LYMPH PERCENT: 34 % (ref 10–50)
POC MID %: 4 % (ref 0–12)
Platelet Count, POC: 334 10*3/uL (ref 142–424)
RBC: 4.64 M/uL — AB (ref 4.69–6.13)
RDW, POC: 14.3 %
WBC: 6.1 10*3/uL (ref 4.6–10.2)

## 2016-02-04 LAB — HEMOGLOBIN A1C
Hgb A1c MFr Bld: 6.6 % — ABNORMAL HIGH (ref <5.7)
Mean Plasma Glucose: 143 mg/dL

## 2016-02-04 LAB — PSA: PSA: 0.5 ng/mL (ref <=4.0)

## 2016-02-04 LAB — GLUCOSE, POCT (MANUAL RESULT ENTRY): POC Glucose: 115 mg/dL — AB (ref 70–99)

## 2016-02-04 MED ORDER — BLOOD GLUCOSE MONITOR KIT: PACK | 0 refills | Status: AC

## 2016-02-04 NOTE — Patient Instructions (Addendum)
-   Sleep aid: Buy Melatonin at any store. Take it an hour before bedtime. It may a week to start working, so don't give up on it.  - Diabetes: Work on those calluses on your heels! You need to be able to feel the ground, so if you were to step on something and cut your heel you can care for it properly. If not, it could lead to infection.  - Urinary frequency: I will call you with the results from your urine and PSA test.     IF you received an x-ray today, you will receive an invoice from [facility]. Please contact [facility] at [phone] with questions or concerns regarding your invoice.   IF you received labwork today, you will receive an invoice from [facility]. Please contact [facility] at [phone] with questions or concerns regarding your invoice.   Our billing staff will not be able to assist you with questions regarding bills from these companies.  You will be contacted with the lab results as soon as they are available. The fastest way to get your results is to activate your My Chart account. Instructions are located on the last page of this paperwork. If you have not heard from us regarding the results in 2 weeks, please contact this office.

## 2016-02-04 NOTE — Progress Notes (Signed)
Metro KungSteven Hostetter  MRN: [id number] DOB: 1956/01/17  PCP: No PCP Per Patient  Subjective:  Pt is a pleasant 60 year old male presenting to clinic for annual exam.   - History of diabetes type 2 x 20 years, well controlled. His glucometer died three months ago. Hasn't checked sugars since then. Occasionally does not take his medications at the same time every day due to his job. Otherwise, is compliant.  Does not remember his last DM foot exam. Says he had DM eye exam several months ago.   - Urinary frequency x 2 weeks. Feels like he is unable to void completely, then has to go again shortly after. Doesn't happen every day. Occasionally wakes up three times at night to urinate.  Denies burning with urination, pelvic pain, blood in urine  - Difficulty staying asleep. He wakes up around midnight many nights. He [place] at [employer] and has to be up at 3. He cannot get back to sleep some nights.  Does not exercise.   Not UTD on colonoscopy.  Trying to [facility] PCP with [facility].   Review of Systems  Constitutional: Negative.   Eyes: Negative.   Respiratory: Negative.   Cardiovascular: Negative.   Gastrointestinal: Negative.   Genitourinary: Positive for decreased urine volume, difficulty urinating and frequency. Negative for discharge, dysuria, flank pain, hematuria, penile pain, scrotal swelling, testicular pain and urgency.    Patient Active Problem List   Diagnosis Date Noted  . Uncontrolled type 2 diabetes mellitus without complication, with long-term current use of insulin (HCC) 06/04/2015  . Dyslipidemia 06/04/2015    Current Outpatient Prescriptions on File Prior to Visit  Medication Sig Dispense Refill  . atorvastatin (LIPITOR) 40 MG tablet Take 1 tablet (40 mg total) by mouth daily. 90 tablet 1  . glimepiride (AMARYL) 2 MG tablet TAKE ONE TABLET BY MOUTH ONCE DAILY BEFORE BREAKFAST 90 tablet 1  . metFORMIN (GLUCOPHAGE) 1000 MG tablet Take 1 tablet (1,000 mg  total) by mouth 2 (two) times daily with a meal. 180 tablet 1  . ramipril (ALTACE) 5 MG capsule Take 1 capsule (5 mg total) by mouth daily. 90 capsule 1   No current facility-administered medications on file prior to visit.     No Known Allergies  Objective:  BP 130/76 (BP Location: Right Arm, Patient Position: Sitting, Cuff Size: Large)   Pulse 70   Temp 98 F (36.7 C) (Oral)   Resp 18   Ht 5' 11.25" (1.81 m)   Wt 210 lb 3.2 oz (95.3 kg)   SpO2 99%   BMI 29.11 kg/m   Physical Exam  Constitutional: He is oriented to person, place, and time and well-developed, well-nourished, and in no distress. No distress.  HENT:  Head: Normocephalic and atraumatic.  Right Ear: External ear normal.  Left Ear: External ear normal.  Nose: Nose normal.  Mouth/Throat: Oropharynx is clear and moist. No oropharyngeal exudate.  Eyes: Conjunctivae and EOM are normal. Pupils are equal, round, and reactive to light. Right eye exhibits no discharge. No scleral icterus.  Neck: Normal range of motion. Neck supple. No tracheal deviation present. No thyromegaly present.  Cardiovascular: Normal rate, regular rhythm, normal heart sounds and intact distal pulses.  Exam reveals no friction rub.   No murmur heard. Pulmonary/Chest: Effort normal and breath sounds normal. No respiratory distress. He has no wheezes.  Abdominal: Soft. Bowel sounds are normal. He exhibits no distension and no mass. There is no tenderness.  Genitourinary: Penis normal. He exhibits no abnormal  testicular mass, no abnormal scrotal mass and no epididymal tenderness. Penis exhibits no lesions and no edema. No discharge found.  Musculoskeletal: Normal range of motion.  Lymphadenopathy:    He has no cervical adenopathy.  Neurological: He is alert and oriented to person, place, and time. He has normal reflexes. Gait normal. GCS score is 15.  Skin: Skin is warm and dry. He is not diaphoretic.  Psychiatric: Mood, memory, affect and judgment  normal.  Vitals reviewed.   Assessment and Plan :  1. Annual physical exam - POCT CBC - Comprehensive metabolic panel - Lipid panel  2. Urinary frequency - PSA - POCT urinalysis dipstick - POCT Microscopic Urinalysis ([facility])  3. Type 2 diabetes mellitus without complication, with long-term current use of insulin (HCC) - Hemoglobin A1c - POCT glucose (manual entry) - HM Diabetes Foot Exam  4. Screen for colon cancer - Ambulatory referral to Gastroenterology   Marco Collie, PA-C  [facility] 02/04/2016 9:04 AM

## 2016-02-09 ENCOUNTER — Telehealth: Payer: Self-pay | Admitting: Physician Assistant

## 2016-02-09 ENCOUNTER — Encounter: Payer: Self-pay | Admitting: Physician Assistant

## 2016-02-09 NOTE — Telephone Encounter (Signed)
If Mr. Jiles HaroldCuthberson calls back. Please ask him if he is still experiencing urinary symptoms.  If so, we will start him on a new medication.   Blood sugar is 104.  A1C is 6.6, which is the same level as your previous visit 3 months ago. You do not have a bladder infection.  PSA is within normal limits.   If you are still experiencing urinary symptoms, try some behavioral modifications:  ?Avoiding fluids prior to bedtime or before going out ?Reducing consumption of mild diuretics such as caffeine and alcohol ?Double voiding to empty the bladder more completely ?You may benefit from voiding in the sitting position    Thank you!!!   Whitney Avry Roedl, PA-C

## 2016-03-24 LAB — HM COLONOSCOPY

## 2016-05-02 ENCOUNTER — Other Ambulatory Visit: Payer: Self-pay | Admitting: Family Medicine

## 2016-08-19 DIAGNOSIS — N529 Male erectile dysfunction, unspecified: Secondary | ICD-10-CM

## 2016-08-19 HISTORY — DX: Male erectile dysfunction, unspecified: N52.9

## 2016-08-29 ENCOUNTER — Other Ambulatory Visit: Payer: Self-pay | Admitting: Family Medicine

## 2016-08-29 DIAGNOSIS — E1165 Type 2 diabetes mellitus with hyperglycemia: Principal | ICD-10-CM

## 2016-08-29 DIAGNOSIS — IMO0001 Reserved for inherently not codable concepts without codable children: Secondary | ICD-10-CM

## 2016-08-29 DIAGNOSIS — Z794 Long term (current) use of insulin: Principal | ICD-10-CM

## 2016-10-02 ENCOUNTER — Other Ambulatory Visit: Payer: Self-pay | Admitting: Physician Assistant

## 2016-10-06 ENCOUNTER — Other Ambulatory Visit: Payer: Self-pay | Admitting: Physician Assistant

## 2016-10-27 ENCOUNTER — Other Ambulatory Visit: Payer: Self-pay | Admitting: Physician Assistant

## 2016-10-27 DIAGNOSIS — E1165 Type 2 diabetes mellitus with hyperglycemia: Principal | ICD-10-CM

## 2016-10-27 DIAGNOSIS — IMO0001 Reserved for inherently not codable concepts without codable children: Secondary | ICD-10-CM

## 2016-10-27 DIAGNOSIS — E785 Hyperlipidemia, unspecified: Secondary | ICD-10-CM

## 2016-10-27 DIAGNOSIS — Z794 Long term (current) use of insulin: Principal | ICD-10-CM

## 2016-10-27 MED ORDER — GLIMEPIRIDE 2 MG PO TABS: ORAL_TABLET | ORAL | 6 refills | Status: DC

## 2016-10-27 MED ORDER — METFORMIN HCL 1000 MG PO TABS: 1000.0000 mg | ORAL_TABLET | Freq: Two times a day (BID) | ORAL | 6 refills | Status: DC

## 2016-10-27 MED ORDER — ATORVASTATIN CALCIUM 40 MG PO TABS: 40.0000 mg | ORAL_TABLET | Freq: Every day | ORAL | 6 refills | Status: DC

## 2016-11-25 ENCOUNTER — Other Ambulatory Visit: Payer: Self-pay | Admitting: Physician Assistant

## 2016-11-25 DIAGNOSIS — IMO0001 Reserved for inherently not codable concepts without codable children: Secondary | ICD-10-CM

## 2016-11-25 DIAGNOSIS — E1165 Type 2 diabetes mellitus with hyperglycemia: Principal | ICD-10-CM

## 2016-11-25 DIAGNOSIS — Z794 Long term (current) use of insulin: Principal | ICD-10-CM

## 2017-07-17 ENCOUNTER — Other Ambulatory Visit: Payer: Self-pay | Admitting: Physician Assistant

## 2017-07-17 DIAGNOSIS — E785 Hyperlipidemia, unspecified: Secondary | ICD-10-CM

## 2017-07-17 DIAGNOSIS — Z794 Long term (current) use of insulin: Secondary | ICD-10-CM

## 2017-07-17 DIAGNOSIS — IMO0001 Reserved for inherently not codable concepts without codable children: Secondary | ICD-10-CM

## 2017-07-17 DIAGNOSIS — E1165 Type 2 diabetes mellitus with hyperglycemia: Secondary | ICD-10-CM

## 2017-08-04 ENCOUNTER — Ambulatory Visit: Payer: Commercial Managed Care - PPO | Admitting: Physician Assistant

## 2017-08-04 ENCOUNTER — Encounter: Payer: Self-pay | Admitting: Physician Assistant

## 2017-08-04 ENCOUNTER — Other Ambulatory Visit: Payer: Self-pay

## 2017-08-04 VITALS — BP 136/84 | HR 86 | Temp 98.0°F | Resp 18 | Ht 71.25 in | Wt 219.2 lb

## 2017-08-04 DIAGNOSIS — E1165 Type 2 diabetes mellitus with hyperglycemia: Secondary | ICD-10-CM | POA: Diagnosis not present

## 2017-08-04 DIAGNOSIS — N529 Male erectile dysfunction, unspecified: Secondary | ICD-10-CM | POA: Diagnosis not present

## 2017-08-04 DIAGNOSIS — E785 Hyperlipidemia, unspecified: Secondary | ICD-10-CM | POA: Diagnosis not present

## 2017-08-04 DIAGNOSIS — Z23 Encounter for immunization: Secondary | ICD-10-CM

## 2017-08-04 DIAGNOSIS — R351 Nocturia: Secondary | ICD-10-CM | POA: Diagnosis not present

## 2017-08-04 DIAGNOSIS — IMO0001 Reserved for inherently not codable concepts without codable children: Secondary | ICD-10-CM

## 2017-08-04 DIAGNOSIS — L603 Nail dystrophy: Secondary | ICD-10-CM | POA: Diagnosis not present

## 2017-08-04 DIAGNOSIS — R350 Frequency of micturition: Secondary | ICD-10-CM | POA: Diagnosis not present

## 2017-08-04 LAB — POCT URINALYSIS DIP (MANUAL ENTRY)
BILIRUBIN UA: NEGATIVE mg/dL
Bilirubin, UA: NEGATIVE
Blood, UA: NEGATIVE
Glucose, UA: NEGATIVE mg/dL
LEUKOCYTES UA: NEGATIVE
NITRITE UA: NEGATIVE
PH UA: 7 (ref 5.0–8.0)
PROTEIN UA: NEGATIVE mg/dL
Spec Grav, UA: 1.015 (ref 1.010–1.025)
UROBILINOGEN UA: 1 U/dL

## 2017-08-04 MED ORDER — ATORVASTATIN CALCIUM 40 MG PO TABS: 40.0000 mg | ORAL_TABLET | Freq: Every day | ORAL | 3 refills | Status: AC

## 2017-08-04 MED ORDER — GLIMEPIRIDE 2 MG PO TABS
ORAL_TABLET | ORAL | 3 refills | Status: DC
Start: 2017-08-04 — End: 2024-02-29

## 2017-08-04 MED ORDER — RAMIPRIL 10 MG PO CAPS: 10.0000 mg | ORAL_CAPSULE | Freq: Every day | ORAL | 3 refills | Status: AC

## 2017-08-04 NOTE — Patient Instructions (Addendum)
1. Please schedule a visit with your eye specialist, and ask that they send me a note. 2. Check your sugar when you feel dizzy after a dose of metformin, before you drink the oj. Let me know what it is. 3. Please schedule follow up for 3 months, either here or with Dr. Tiburcio PeaHarris.    IF you received an x-ray today, you will receive an invoice from [facility]. Please contact [facility] at [phone] with questions or concerns regarding your invoice.   IF you received labwork today, you will receive an invoice from [facility]. Please contact [facility] at [phone] with questions or concerns regarding your invoice.   Our billing staff will not be able to assist you with questions regarding bills from these companies.  You will be contacted with the lab results as soon as they are available. The fastest way to get your results is to activate your My Chart account. Instructions are located on the last page of this paperwork. If you have not heard from us regarding the results in 2 weeks, please contact this office.

## 2017-08-04 NOTE — Progress Notes (Signed)
Subjective:    Patient ID: Devin Novak, male    DOB: September 19, 1955, 62 y.o.   MRN: [id number]  Urinary Frequency   This is a chronic problem. The current episode started more than 1 month ago. The problem occurs every urination. The problem has been unchanged. The patient is experiencing no pain. There has been no fever. He is sexually active. Associated symptoms include frequency. Pertinent negatives include no chills, flank pain, hematuria, nausea or urgency (No urgency. just increased need to urinate.).   Patient has taken "one and a half" Metformin pills at night (is supposed to take one in the AM, one in the PM,) and this seems to decreased his urinary frequency. Patient states that he often does not take his night time Metformin dose as it gives him "the runs."  Patient would also like to have his Testosterone tested at this time as he has had low libido and a hard time performing sexually. He has had it tested around one year ago at a urologist, and he says it was "borderline low." He was given a topical gel at that time that did not help. He has not followed up on the problem since.  Patient Active Problem List   Diagnosis Date Noted  . Erectile dysfunction 08/04/2017  . Dystrophic nail 08/04/2017  . Uncontrolled type 2 diabetes mellitus without complication, with long-term current use of insulin ([place]) 06/04/2015  . Dyslipidemia 06/04/2015   No Known Allergies   Prior to Admission medications   Medication Sig Start Date End Date Taking? Authorizing Provider  atorvastatin (LIPITOR) 40 MG tablet Take 1 tablet (40 mg total) by mouth daily. 08/04/17  Yes Jeffery, Chelle, PA-C  blood glucose meter kit and supplies KIT Dispense based on patient and insurance preference. Use up to four times daily as directed. (FOR ICD-9 250.00, 250.01). 02/04/16  Yes McVey, Gelene Mink, PA-C  glimepiride (AMARYL) 2 MG tablet TAKE ONE TABLET BY MOUTH ONCE DAILY BEFORE  BREAKFAST 08/04/17  Yes  Jacqulynn Cadet, Chelle, PA-C  hydrOXYzine (ATARAX/VISTARIL) 25 MG tablet  06/08/17  Yes [provider]  metFORMIN (GLUCOPHAGE) 1000 MG tablet Take 1 tablet (1,000 mg total) by mouth 2 (two) times daily with a meal. 10/27/16  Yes McVey, Gelene Mink, PA-C  ramipril (ALTACE) 10 MG capsule Take 1 capsule (10 mg total) by mouth daily. 08/04/17  Yes Harrison Mons, PA-C     Past Medical History:  Diagnosis Date  . Diabetes mellitus without complication ([place])   . ED (erectile dysfunction) 08/19/2016  . Hyperlipidemia    Social History   Socioeconomic History  . Marital status: Married    Spouse name: Lorriane Shire  . Number of children: 1  . Years of education: Not on file  . Highest education level: Not on file  Social Needs  . Financial resource strain: Not on file  . Food insecurity - worry: Not on file  . Food insecurity - inability: Not on file  . Transportation needs - medical: Not on file  . Transportation needs - non-medical: Not on file  Occupational History  . Occupation: Engineer, building services  Tobacco Use  . Smoking status: Never Smoker  . Smokeless tobacco: Current User    Types: Chew  Substance and Sexual Activity  . Alcohol use: Not on file  . Drug use: Not on file  . Sexual activity: Yes  Other Topics Concern  . Not on file  Social History Narrative   Marital status: married to wife Lorriane Shire since  2013     Children:  1 son; 4 grandchildren in area      Lives: with wife      Employment:  Environmental manager; worked for [employer] for 30 years      Tobacco:  None      Alcohol: beer 2 per day      Exercise:  none   Family History  Problem Relation Age of Onset  . Cancer Mother 95       colon cancer  . Diabetes Mother    Past Surgical History:  Procedure Laterality Date  . COLONOSCOPY  Not sure    Review of Systems  Constitutional: Negative.  Negative for activity change, appetite change, chills and unexpected weight change.    HENT: Negative.  Negative for congestion, dental problem, rhinorrhea, sinus pressure and sinus pain.   Eyes: Negative.  Negative for pain, itching and visual disturbance.  Respiratory: Negative.  Negative for cough, choking, chest tightness and shortness of breath.   Cardiovascular: Negative.  Negative for chest pain and leg swelling.  Gastrointestinal: Positive for diarrhea (From taking Metformin.). Negative for abdominal distention, constipation and nausea.  Endocrine: Positive for polyuria. Negative for cold intolerance, heat intolerance, polydipsia and polyphagia.  Genitourinary: Positive for frequency. Negative for decreased urine volume, difficulty urinating, dysuria, flank pain, hematuria and urgency (No urgency. just increased need to urinate.).  Musculoskeletal: Negative.  Negative for arthralgias, back pain and myalgias.  Neurological: Negative.  Negative for dizziness, light-headedness and headaches.  Psychiatric/Behavioral: Negative for sleep disturbance (Most nights wakes up in middle of night and falls back asleep. Some nights he's "up all night, lays there all night.").       Objective:   Physical Exam  Constitutional: He is oriented to person, place, and time. He appears well-developed and well-nourished.  BP 136/84 (BP Location: Left Arm, Patient Position: Sitting, Cuff Size: Normal)   Pulse 86   Temp 98 F (36.7 C) (Oral)   Resp 18   Ht 5' 11.25" (1.81 m)   Wt 219 lb 3.2 oz (99.4 kg)   SpO2 98%   BMI 30.36 kg/m   HENT:  Head: Normocephalic and atraumatic.  Right Ear: External ear normal.  Left Ear: External ear normal.  Nose: Nose normal.  Mouth/Throat: Oropharynx is clear and moist.  Eyes: Conjunctivae and EOM are normal. Pupils are equal, round, and reactive to light.  Neck: Normal range of motion. Neck supple.  Cardiovascular: Normal rate, regular rhythm and intact distal pulses. Exam reveals no gallop and no friction rub.  No murmur heard. Pulmonary/Chest:  Effort normal and breath sounds normal.  Abdominal: Soft. Bowel sounds are normal. He exhibits no distension. There is no tenderness. There is no guarding.  Musculoskeletal: Normal range of motion. He exhibits no edema.  Neurological: He is alert and oriented to person, place, and time. He has normal reflexes. He displays normal reflexes (Patellar reflex (+)1.).  Skin: Skin is warm and dry.  Skin on feet flaky and dry. Toenails thick, flaky and brown.  Psychiatric: He has a normal mood and affect. His behavior is normal. Judgment and thought content normal.    Results for orders placed or performed in visit on 08/04/17  POCT urinalysis dipstick  Result Value Ref Range   Color, UA yellow yellow   Clarity, UA clear clear   Glucose, UA negative negative mg/dL   Bilirubin, UA negative negative   Ketones, POC UA negative negative mg/dL   Spec Grav, UA 1.015 1.010 -  1.025   Blood, UA negative negative   pH, UA 7.0 5.0 - 8.0   Protein Ur, POC negative negative mg/dL   Urobilinogen, UA 1.0 0.2 or 1.0 E.U./dL   Nitrite, UA Negative Negative   Leukocytes, UA Negative Negative      Assessment & Plan:   1. Uncontrolled type 2 diabetes mellitus without complication, with long-term current use of insulin (HCC)  - Hemoglobin A1c - Comprehensive metabolic panel - Urinalysis, dipstick only - ramipril (ALTACE) 10 MG capsule; Take 1 capsule (10 mg total) by mouth daily.  Dispense: 90 capsule; Refill: 3 - glimepiride (AMARYL) 2 MG tablet; TAKE ONE TABLET BY MOUTH ONCE DAILY BEFORE  BREAKFAST  Dispense: 90 tablet; Refill: 3  Patient has been out of his Glimepiride for the past 2 days. Additionally, he is not taking his Metformin as directed. His urine analysis was negative for glucose, but his symptoms are indicative of T2DM that is not well managed. Patient was counseled on taking Metformin as instructed to help maintain a helath blood glucose level.   2. Dyslipidemia  - Comprehensive metabolic  panel - Lipid panel - atorvastatin (LIPITOR) 40 MG tablet; Take 1 tablet (40 mg total) by mouth daily.  Dispense: 90 tablet; Refill: 3 Patient's lipid's and CMP were done at this visit. His dyslipidemia is currently managed on Atorvastatin 40 MG PO daily.   3. Urinary frequency  - POCT urinalysis dipstick - Urine Culture - Urine Microscopic  Patient's urinary frequency was one of his chief complaint's at this visit. Patient does not take Metformin as prescribed, but notices improvement in urinary frequency when Metformin is taken as directed. Patient's POCT urinalysis dipstick was negative for glucose.   4. Nocturia  - PSA - Urine Culture - Urine Microscopic Patient's nocturia wakes him up 2-3 times a night and was one of his chief complaints at this visit. His POCT urinalysis dipstick was negative for glucose.   5. Erectile dysfunction, unspecified erectile dysfunction type  - TSH - TestT+TestF+SHBG TSH, Testosterone, and SHBG were tested to get to the root of the cause of his ED.   6. Dystrophic nail  Patient has a brown, thick, and curled dystrophic nail on the great toe of his left foot. He has not seen podiatry regarding foot care, but does get pedicures at a nail salon. We have put in a referral to podiatry and recommended it to our patient, emphasizing the need for proper foot care in diabetic patients.  7. Flu vaccine need Patient was given his annual flu vaccination at this visit. He is UTD on vaccinations.   Return in about 3 months (around 11/01/2017) for re-evaluation of diabetes, choelsterol, either here or with Dr. Kenton Kingfisher. - Flu Vaccine QUAD 36+ mos IM

## 2017-08-04 NOTE — Progress Notes (Signed)
Patient ID: Devin Novak, male    DOB: Apr 06, 1956, 62 y.o.   MRN: [id number]  PCP: Shirline Frees, MD  Chief Complaint  Patient presents with  . Diabetes    Pt states when he checks his sugars they have been between 140-160 before eating.  Marland Kitchen Urinary Frequency    x2 months, pt states he doesn't have any burning or lower abdominal/back pain.   . Follow-up    Subjective:   Presents for evaluation of diabetes, and reports 2 months of increased urinary frequency.  Last visit and labs here were 02/04/2016. A1C 6.6% LDL 55  He reports fasting glucose readings 140-160.  He doesn't always remember to take both doses of metformin each day. Reports loose stools with metformin. Sometimes adds an additional 500 mg in the evenings (1500 mg total evening dose).  Ran out of glimiperide 2 days ago.  Desires testosterone check due to difficulty getting and maintaining an erection. Recalls that the level was "borderline low" several years ago when checked by urology. He reports that he was prescribed a topical product that was not effective, and he did not follow-up.  Urinary frequency not associated with urgency or burning, hematuria, penile discharge. No fever, chills.   Review of Systems Constitutional: Negative.  Negative for activity change, appetite change, chills and unexpected weight change.  HENT: Negative.  Negative for congestion, dental problem, rhinorrhea, sinus pressure and sinus pain.   Eyes: Negative.  Negative for pain, itching and visual disturbance.  Respiratory: Negative.  Negative for cough, choking, chest tightness and shortness of breath.   Cardiovascular: Negative.  Negative for chest pain and leg swelling.  Gastrointestinal: Positive for diarrhea (From taking Metformin.). Negative for abdominal distention, constipation and nausea.  Endocrine: Positive for polyuria. Negative for cold intolerance, heat intolerance, polydipsia and polyphagia.  Genitourinary:  Positive for frequency. Negative for decreased urine volume, difficulty urinating, dysuria, flank pain, hematuria and urgency (No urgency. just increased need to urinate.).  Musculoskeletal: Negative.  Negative for arthralgias, back pain and myalgias.  Neurological: Negative.  Negative for dizziness, light-headedness and headaches.  Psychiatric/Behavioral: Negative for sleep disturbance (Most nights wakes up in middle of night and falls back asleep. Some nights he's "up all night, lays there all night.").       Patient Active Problem List   Diagnosis Date Noted  . Uncontrolled type 2 diabetes mellitus without complication, with long-term current use of insulin ([place]) 06/04/2015  . Dyslipidemia 06/04/2015     Prior to Admission medications   Medication Sig Start Date End Date Taking? Authorizing Provider  atorvastatin (LIPITOR) 40 MG tablet Take 1 tablet (40 mg total) by mouth daily. 10/27/16  Yes McVey, Gelene Mink, PA-C  blood glucose meter kit and supplies KIT Dispense based on patient and insurance preference. Use up to four times daily as directed. (FOR ICD-9 250.00, 250.01). 02/04/16  Yes McVey, Gelene Mink, PA-C  glimepiride (AMARYL) 2 MG tablet TAKE ONE TABLET BY MOUTH ONCE DAILY BEFORE  BREAKFAST 10/27/16  Yes McVey, Gelene Mink, PA-C  hydrOXYzine (ATARAX/VISTARIL) 25 MG tablet  06/08/17  Yes [provider]  metFORMIN (GLUCOPHAGE) 1000 MG tablet Take 1 tablet (1,000 mg total) by mouth 2 (two) times daily with a meal. 10/27/16  Yes McVey, Gelene Mink, PA-C  ramipril (ALTACE) 10 MG capsule  07/18/17  Yes [provider]     No Known Allergies     Objective:  Physical Exam  Constitutional: He is oriented to person, place, and  time. He appears well-developed and well-nourished. He is active and cooperative. No distress.  BP 136/84 (BP Location: Left Arm, Patient Position: Sitting, Cuff Size: Normal)   Pulse 86   Temp 98 F (36.7 C) (Oral)    Resp 18   Ht 5' 11.25" (1.81 m)   Wt 219 lb 3.2 oz (99.4 kg)   SpO2 98%   BMI 30.36 kg/m   HENT:  Head: Normocephalic and atraumatic.  Right Ear: Hearing normal.  Left Ear: Hearing normal.  Eyes: Conjunctivae are normal. No scleral icterus.  Neck: Normal range of motion. Neck supple. No thyromegaly present.  Cardiovascular: Normal rate, regular rhythm and normal heart sounds.  Pulses:      Radial pulses are 2+ on the right side, and 2+ on the left side.  Pulmonary/Chest: Effort normal and breath sounds normal.  Lymphadenopathy:       Head (right side): No tonsillar, no preauricular, no posterior auricular and no occipital adenopathy present.       Head (left side): No tonsillar, no preauricular, no posterior auricular and no occipital adenopathy present.    He has no cervical adenopathy.       Right: No supraclavicular adenopathy present.       Left: No supraclavicular adenopathy present.  Neurological: He is alert and oriented to person, place, and time. No sensory deficit.  Skin: Skin is warm, dry and intact. No rash noted. No cyanosis or erythema. Nails show no clubbing.  Toenails are hypertrophic and hyperpigmented.  Psychiatric: He has a normal mood and affect. His speech is normal and behavior is normal.   Results for orders placed or performed in visit on 08/04/17  POCT urinalysis dipstick  Result Value Ref Range   Color, UA yellow yellow   Clarity, UA clear clear   Glucose, UA negative negative mg/dL   Bilirubin, UA negative negative   Ketones, POC UA negative negative mg/dL   Spec Grav, UA 1.015 1.010 - 1.025   Blood, UA negative negative   pH, UA 7.0 5.0 - 8.0   Protein Ur, POC negative negative mg/dL   Urobilinogen, UA 1.0 0.2 or 1.0 E.U./dL   Nitrite, UA Negative Negative   Leukocytes, UA Negative Negative     Diabetic Foot Exam - Simple   Simple Foot Form Diabetic Foot exam was performed with the following findings:  Yes 08/04/2017  8:26 AM  Visual  Inspection No deformities, no ulcerations, no other skin breakdown bilaterally:  Yes Sensation Testing Intact to touch and monofilament testing bilaterally:  Yes Pulse Check Posterior Tibialis and Dorsalis pulse intact bilaterally:  Yes Comments    Wt Readings from Last 3 Encounters:  08/04/17 219 lb 3.2 oz (99.4 kg)  02/04/16 210 lb 3.2 oz (95.3 kg)  10/22/15 215 lb (97.5 kg)         Assessment & Plan:   Problem List Items Addressed This Visit    Uncontrolled diabetes mellitus type 2 without complications ([place]) - Primary    Suspect less control of diabetes than previously, given the increased urinary frequency, though UA today without glucosuria is reassuring. Await labs. Encourage taking medications as prescribed, and that if the adverse effects are not tolerable, to let me or his PCP know so that changes can be made. Dietary improvements would likely help in this case (diarrhea) with metformin.      Relevant Medications   ramipril (ALTACE) 10 MG capsule   glimepiride (AMARYL) 2 MG tablet   atorvastatin (LIPITOR) 40 MG  tablet   Other Relevant Orders   Hemoglobin A1c (Completed)   Comprehensive metabolic panel (Completed)   Urinalysis, dipstick only (Completed)   Dyslipidemia    Await labs. Adjust regimen as indicated by results. Tolerating atorvastatin.      Relevant Medications   atorvastatin (LIPITOR) 40 MG tablet   Other Relevant Orders   Comprehensive metabolic panel (Completed)   Lipid panel (Completed)   Erectile dysfunction    Update testosterone level. Consider referral back to urology, or to endocrinology.      Relevant Orders   TSH (Completed)   TestT+TestF+SHBG (Completed)   Dystrophic nail    Recommend regular nail care.       Other Visit Diagnoses    Urinary frequency       Likely due to increased glucose. BPH also possible. Await labs.   Relevant Orders   POCT urinalysis dipstick (Completed)   Urine Culture (Completed)   Urine Microscopic     Urine Microscopic (Completed)   Nocturia       Likely due to increased glucose. BPH also possible. Await labs.   Relevant Orders   PSA (Completed)   Urine Culture (Completed)   Urine Microscopic   Flu vaccine need       Relevant Orders   Flu Vaccine QUAD 36+ mos IM (Completed)       Return in about 3 months (around 11/01/2017) for re-evaluation of diabetes, choelsterol, either here or with Dr. Kenton Kingfisher.   Fara Chute, PA-C [facility]

## 2017-08-05 LAB — URINALYSIS, MICROSCOPIC ONLY: CASTS: NONE SEEN /LPF

## 2017-08-05 LAB — URINE CULTURE: Organism ID, Bacteria: NO GROWTH

## 2017-08-07 NOTE — Assessment & Plan Note (Signed)
Recommend regular nail care.

## 2017-08-07 NOTE — Assessment & Plan Note (Signed)
Await labs. Adjust regimen as indicated by results. Tolerating atorvastatin.

## 2017-08-07 NOTE — Assessment & Plan Note (Signed)
Suspect less control of diabetes than previously, given the increased urinary frequency, though UA today without glucosuria is reassuring. Await labs. Encourage taking medications as prescribed, and that if the adverse effects are not tolerable, to let me or his PCP know so that changes can be made. Dietary improvements would likely help in this case (diarrhea) with metformin.

## 2017-08-07 NOTE — Assessment & Plan Note (Signed)
Update testosterone level. Consider referral back to urology, or to endocrinology.

## 2017-08-09 ENCOUNTER — Telehealth: Payer: Self-pay | Admitting: Physician Assistant

## 2017-08-09 LAB — COMPREHENSIVE METABOLIC PANEL
A/G RATIO: 1.5 (ref 1.2–2.2)
ALBUMIN: 4.2 g/dL (ref 3.6–4.8)
ALT: 18 IU/L (ref 0–44)
AST: 14 IU/L (ref 0–40)
Alkaline Phosphatase: 90 IU/L (ref 39–117)
BILIRUBIN TOTAL: 0.3 mg/dL (ref 0.0–1.2)
BUN / CREAT RATIO: 10 (ref 10–24)
BUN: 10 mg/dL (ref 8–27)
CALCIUM: 9.4 mg/dL (ref 8.6–10.2)
CHLORIDE: 104 mmol/L (ref 96–106)
CO2: 23 mmol/L (ref 20–29)
Creatinine, Ser: 0.98 mg/dL (ref 0.76–1.27)
GFR calc Af Amer: 96 mL/min/{1.73_m2} (ref >59)
GFR, EST NON AFRICAN AMERICAN: 83 mL/min/{1.73_m2} (ref >59)
GLOBULIN, TOTAL: 2.8 g/dL (ref 1.5–4.5)
Glucose: 124 mg/dL — ABNORMAL HIGH (ref 65–99)
POTASSIUM: 4.7 mmol/L (ref 3.5–5.2)
Sodium: 141 mmol/L (ref 134–144)
Total Protein: 7 g/dL (ref 6.0–8.5)

## 2017-08-09 LAB — URINALYSIS, DIPSTICK ONLY
Bilirubin, UA: NEGATIVE
Glucose, UA: NEGATIVE
Ketones, UA: NEGATIVE
Leukocytes, UA: NEGATIVE
Nitrite, UA: NEGATIVE
PH UA: 6.5 (ref 5.0–7.5)
PROTEIN UA: NEGATIVE
RBC, UA: NEGATIVE
Specific Gravity, UA: 1.017 (ref 1.005–1.030)
UUROB: 1 mg/dL (ref 0.2–1.0)

## 2017-08-09 LAB — LIPID PANEL
CHOL/HDL RATIO: 2.7 ratio (ref 0.0–5.0)
Cholesterol, Total: 131 mg/dL (ref 100–199)
HDL: 48 mg/dL (ref >39)
LDL Calculated: 66 mg/dL (ref 0–99)
Triglycerides: 83 mg/dL (ref 0–149)
VLDL Cholesterol Cal: 17 mg/dL (ref 5–40)

## 2017-08-09 LAB — TESTT+TESTF+SHBG
SEX HORMONE BINDING: 23.8 nmol/L (ref 19.3–76.4)
TESTOSTERONE FREE: 8.9 pg/mL (ref 6.6–18.1)
Testosterone, total: 334.4 ng/dL (ref 264.0–916.0)

## 2017-08-09 LAB — HEMOGLOBIN A1C
Est. average glucose Bld gHb Est-mCnc: 148 mg/dL
Hgb A1c MFr Bld: 6.8 % — ABNORMAL HIGH (ref 4.8–5.6)

## 2017-08-09 LAB — PSA: Prostate Specific Ag, Serum: 0.8 ng/mL (ref 0.0–4.0)

## 2017-08-09 LAB — TSH: TSH: 3.04 u[IU]/mL (ref 0.450–4.500)

## 2017-08-09 NOTE — Telephone Encounter (Signed)
Copied from CRM [id number]. Topic: Quick Communication - See Telephone Encounter >> Aug 09, 2017 12:48 PM Rudi CocoLathan, Sheva Mcdougle M, NT wrote: CRM for notification. See Telephone encounter for:   08/09/17. Pt. Calling to see if lab results are back. Let pt. Know that someone will give him a call when done

## 2017-08-10 ENCOUNTER — Encounter: Payer: Self-pay | Admitting: Physician Assistant

## 2017-08-10 NOTE — Telephone Encounter (Signed)
Pt calling again to get lab results. 

## 2017-08-11 NOTE — Telephone Encounter (Signed)
Patient wanting someone to call him and give him his lab results verbally I advised him that it was mailed to him please call pt on cell

## 2017-08-15 NOTE — Telephone Encounter (Signed)
Pt notified of results and verbalized understanding  

## 2017-11-02 ENCOUNTER — Other Ambulatory Visit: Payer: Self-pay

## 2017-11-02 ENCOUNTER — Encounter: Payer: Self-pay | Admitting: Physician Assistant

## 2017-11-02 ENCOUNTER — Ambulatory Visit: Payer: Commercial Managed Care - PPO | Admitting: Physician Assistant

## 2017-11-02 VITALS — BP 138/74 | HR 93 | Temp 97.6°F | Resp 16 | Ht 71.73 in | Wt 210.4 lb

## 2017-11-02 DIAGNOSIS — L299 Pruritus, unspecified: Secondary | ICD-10-CM

## 2017-11-02 DIAGNOSIS — E119 Type 2 diabetes mellitus without complications: Secondary | ICD-10-CM | POA: Diagnosis not present

## 2017-11-02 DIAGNOSIS — R51 Headache: Secondary | ICD-10-CM | POA: Diagnosis not present

## 2017-11-02 DIAGNOSIS — R519 Headache, unspecified: Secondary | ICD-10-CM

## 2017-11-02 LAB — COMPREHENSIVE METABOLIC PANEL
A/G RATIO: 1.6 (ref 1.2–2.2)
ALK PHOS: 107 IU/L (ref 39–117)
ALT: 16 IU/L (ref 0–44)
AST: 12 IU/L (ref 0–40)
Albumin: 4.5 g/dL (ref 3.6–4.8)
BILIRUBIN TOTAL: 0.6 mg/dL (ref 0.0–1.2)
BUN/Creatinine Ratio: 13 (ref 10–24)
BUN: 13 mg/dL (ref 8–27)
CHLORIDE: 100 mmol/L (ref 96–106)
CO2: 22 mmol/L (ref 20–29)
Calcium: 9.7 mg/dL (ref 8.6–10.2)
Creatinine, Ser: 1.03 mg/dL (ref 0.76–1.27)
GFR calc Af Amer: 91 mL/min/{1.73_m2} (ref >59)
GFR calc non Af Amer: 79 mL/min/{1.73_m2} (ref >59)
GLUCOSE: 149 mg/dL — AB (ref 65–99)
Globulin, Total: 2.8 g/dL (ref 1.5–4.5)
POTASSIUM: 5 mmol/L (ref 3.5–5.2)
Sodium: 137 mmol/L (ref 134–144)
TOTAL PROTEIN: 7.3 g/dL (ref 6.0–8.5)

## 2017-11-02 LAB — HEMOGLOBIN A1C
ESTIMATED AVERAGE GLUCOSE: 143 mg/dL
HEMOGLOBIN A1C: 6.6 % — AB (ref 4.8–5.6)

## 2017-11-02 MED ORDER — METFORMIN HCL 1000 MG PO TABS: 1000.0000 mg | ORAL_TABLET | Freq: Two times a day (BID) | ORAL | 3 refills | Status: AC

## 2017-11-02 MED ORDER — HYDROXYZINE HCL 25 MG PO TABS: 25.0000 mg | ORAL_TABLET | Freq: Three times a day (TID) | ORAL | 0 refills | Status: AC | PRN

## 2017-11-02 NOTE — Progress Notes (Signed)
Patient ID: Devin Novak, male    DOB: 09-Aug-1955, 62 y.o.   MRN: [id number]  PCP: Shirline Frees, MD  Chief Complaint  Patient presents with  . Diabetes    follow up     Subjective:   Presents for evaluation of diabetes.  At his visit in 07/2017, A1C 6.8%, LDL 66.  He tolerating his medications without difficulty, though notes looser stools with metformin. Occasional hypoglycemia. Feels shaky. Drinks OJ. With hyperglycemia, he experiences fatigue  Home glucose checks occasionally.  Needs eye exam, it has been a little over a year since his last visit.  In addition, he reports headache that began yesterday. Began as LEFT sided head tenderness yesterday. No trauma or injury. No associated visual changes.  Then developed episodic sharp shooting pains in the LEFT brow, shooting through the forehead, to the parietal region, and sometimes to the back of the head. Lasts 5-10 seconds. Can occur every 15-120 minutes. No associated visual changes, dizziness, weakness. No associated facial droop, slurred speech, cognitive changes. No tearing from the eye.    Review of Systems As above. No chest pain, shortness of breath, blurred vision, dizziness. No urinary urgency, frequency, burning. No fever, chills. No muscle or joint pain.    Patient Active Problem List   Diagnosis Date Noted  . Erectile dysfunction 08/04/2017  . Dystrophic nail 08/04/2017  . Diabetes mellitus type 2, uncomplicated ([place]) 74/05/8785  . Dyslipidemia 06/04/2015     Prior to Admission medications   Medication Sig Start Date End Date Taking? Authorizing Provider  atorvastatin (LIPITOR) 40 MG tablet Take 1 tablet (40 mg total) by mouth daily. 08/04/17  Yes Madoc Holquin, PA-C  blood glucose meter kit and supplies KIT Dispense based on patient and insurance preference. Use up to four times daily as directed. (FOR ICD-9 250.00, 250.01). 02/04/16  Yes McVey, Gelene Mink, PA-C  glimepiride  (AMARYL) 2 MG tablet TAKE ONE TABLET BY MOUTH ONCE DAILY BEFORE  BREAKFAST 08/04/17  Yes Jacqulynn Cadet, Ahnesti Townsend, PA-C  hydrOXYzine (ATARAX/VISTARIL) 25 MG tablet  06/08/17  Yes [provider]  metFORMIN (GLUCOPHAGE) 1000 MG tablet Take 1 tablet (1,000 mg total) by mouth 2 (two) times daily with a meal. 10/27/16  Yes McVey, Gelene Mink, PA-C  ramipril (ALTACE) 10 MG capsule Take 1 capsule (10 mg total) by mouth daily. 08/04/17  Yes Tyce Delcid, PA-C     No Known Allergies     Objective:  Physical Exam  Constitutional: He is oriented to person, place, and time. He appears well-developed and well-nourished. He is active and cooperative. No distress.  BP 138/74   Pulse 93   Temp 97.6 F (36.4 C)   Resp 16   Ht 5' 11.73" (1.822 m)   Wt 210 lb 6.4 oz (95.4 kg)   SpO2 100%   BMI 28.75 kg/m   HENT:  Head: Normocephalic and atraumatic.  Right Ear: Hearing normal.  Left Ear: Hearing normal.  Eyes: Conjunctivae are normal. No scleral icterus.  Neck: Normal range of motion. Neck supple. No thyromegaly present.  Cardiovascular: Normal rate, regular rhythm and normal heart sounds.  Pulses:      Radial pulses are 2+ on the right side, and 2+ on the left side.  Pulmonary/Chest: Effort normal and breath sounds normal.  Lymphadenopathy:       Head (right side): No tonsillar, no preauricular, no posterior auricular and no occipital adenopathy present.       Head (left side): No tonsillar, no preauricular,  no posterior auricular and no occipital adenopathy present.    He has no cervical adenopathy.       Right: No supraclavicular adenopathy present.       Left: No supraclavicular adenopathy present.  Neurological: He is alert and oriented to person, place, and time. He has normal strength. He displays normal reflexes. No cranial nerve deficit or sensory deficit. He exhibits normal muscle tone. Coordination and gait normal.  Reflex Scores:      Bicep reflexes are 2+ on the right side  and 2+ on the left side.      Patellar reflexes are 2+ on the right side and 2+ on the left side.      Achilles reflexes are 2+ on the right side and 2+ on the left side. Skin: Skin is warm, dry and intact. No rash noted. No cyanosis or erythema. Nails show no clubbing.  Psychiatric: He has a normal mood and affect. His speech is normal and behavior is normal.    Wt Readings from Last 3 Encounters:  11/05/17 211 lb (95.7 kg)  11/02/17 210 lb 6.4 oz (95.4 kg)  08/04/17 219 lb 3.2 oz (99.4 kg)       Assessment & Plan:   Problem List Items Addressed This Visit    Diabetes mellitus type 2, uncomplicated ([place]) - Primary    Controlled at last visit.  Continue current regimen.      Relevant Medications   metFORMIN (GLUCOPHAGE) 1000 MG tablet   Other Relevant Orders   Comprehensive metabolic panel (Completed)   Hemoglobin A1c (Completed)    Other Visit Diagnoses    Acute nonintractable headache, unspecified headache type       Unclear etiology.  Recommend acetaminophen.  Reevaluate if symptoms worsen or persist.   Pruritus       Episodic.  Continue PRN hydroxyzine.   Relevant Medications   hydrOXYzine (ATARAX/VISTARIL) 25 MG tablet       Return in about 3 months (around 02/02/2018) for re-evalaution of diabetes, cholesterol, blood pressure.   Fara Chute, PA-C [facility]

## 2017-11-02 NOTE — Patient Instructions (Addendum)
Your My Chart User ID is [id number]. We changed your password today.  For the head pain, start with some acetaminophen 325 mg (Tylenol). If that doesn't work, try ibuprofen (400 mg). You can also apply a warm compress or ice pack, to see if one or both of those help. The pain will likely resolve in a couple of days.  Go ahead and call Dr.Harris' office to schedule your next visit in three months. If it will be longer, consider scheduling here in the meantime.  I recommend Whitney McVey, PA-C here (or any of our other providers).    IF you received an x-ray today, you will receive an invoice from [facility]. Please contact [facility] at [phone] with questions or concerns regarding your invoice.   IF you received labwork today, you will receive an invoice from [facility]. Please contact [facility] at [phone] with questions or concerns regarding your invoice.   Our billing staff will not be able to assist you with questions regarding bills from these companies.  You will be contacted with the lab results as soon as they are available. The fastest way to get your results is to activate your My Chart account. Instructions are located on the last page of this paperwork. If you have not heard from [place] regarding the results in 2 weeks, please contact this office.

## 2017-11-05 ENCOUNTER — Emergency Department (HOSPITAL_COMMUNITY): Payer: Commercial Managed Care - PPO

## 2017-11-05 ENCOUNTER — Emergency Department (HOSPITAL_COMMUNITY)
Admission: EM | Admit: 2017-11-05 | Discharge: 2017-11-05 | Disposition: A | Discharge status: Home / Self Care | Payer: Commercial Managed Care - PPO | Attending: Emergency Medicine | Admitting: Emergency Medicine

## 2017-11-05 ENCOUNTER — Encounter (HOSPITAL_COMMUNITY): Payer: Self-pay | Admitting: Emergency Medicine

## 2017-11-05 DIAGNOSIS — R51 Headache: Secondary | ICD-10-CM

## 2017-11-05 DIAGNOSIS — F17210 Nicotine dependence, cigarettes, uncomplicated: Secondary | ICD-10-CM | POA: Diagnosis not present

## 2017-11-05 DIAGNOSIS — Z79899 Other long term (current) drug therapy: Secondary | ICD-10-CM | POA: Insufficient documentation

## 2017-11-05 DIAGNOSIS — I10 Essential (primary) hypertension: Secondary | ICD-10-CM

## 2017-11-05 DIAGNOSIS — G510 Bell's palsy: Secondary | ICD-10-CM | POA: Diagnosis not present

## 2017-11-05 DIAGNOSIS — E119 Type 2 diabetes mellitus without complications: Secondary | ICD-10-CM | POA: Insufficient documentation

## 2017-11-05 DIAGNOSIS — Z7984 Long term (current) use of oral hypoglycemic drugs: Secondary | ICD-10-CM | POA: Insufficient documentation

## 2017-11-05 DIAGNOSIS — R519 Headache, unspecified: Secondary | ICD-10-CM

## 2017-11-05 LAB — CBC WITH DIFFERENTIAL/PLATELET
BASOS ABS: 0 10*3/uL (ref 0.0–0.1)
Basophils Relative: 0 %
Eosinophils Absolute: 0.3 10*3/uL (ref 0.0–0.7)
Eosinophils Relative: 4 %
HEMATOCRIT: 45.3 % (ref 39.0–52.0)
Hemoglobin: 15.1 g/dL (ref 13.0–17.0)
Lymphocytes Relative: 29 %
Lymphs Abs: 2 10*3/uL (ref 0.7–4.0)
MCH: 29.3 pg (ref 26.0–34.0)
MCHC: 33.3 g/dL (ref 30.0–36.0)
MCV: 87.8 fL (ref 78.0–100.0)
MONO ABS: 0.6 10*3/uL (ref 0.1–1.0)
Monocytes Relative: 9 %
NEUTROS ABS: 4 10*3/uL (ref 1.7–7.7)
Neutrophils Relative %: 58 %
Platelets: 385 10*3/uL (ref 150–400)
RBC: 5.16 MIL/uL (ref 4.22–5.81)
RDW: 13.7 % (ref 11.5–15.5)
WBC: 6.9 10*3/uL (ref 4.0–10.5)

## 2017-11-05 LAB — BASIC METABOLIC PANEL
Anion gap: 10 (ref 5–15)
BUN: 14 mg/dL (ref 6–20)
CHLORIDE: 108 mmol/L (ref 101–111)
CO2: 25 mmol/L (ref 22–32)
Calcium: 9.6 mg/dL (ref 8.9–10.3)
Creatinine, Ser: 1.11 mg/dL (ref 0.61–1.24)
GFR calc non Af Amer: >60 mL/min (ref >60)
Glucose, Bld: 202 mg/dL — ABNORMAL HIGH (ref 65–99)
POTASSIUM: 4.5 mmol/L (ref 3.5–5.1)
SODIUM: 143 mmol/L (ref 135–145)

## 2017-11-05 MED ORDER — ASPIRIN-ACETAMINOPHEN-CAFFEINE 250-250-65 MG PO TABS
1.0000 | ORAL_TABLET | Freq: Four times a day (QID) | ORAL | 0 refills | Status: AC | PRN
Start: 2017-11-05 — End: ?

## 2017-11-05 MED ORDER — DIPHENHYDRAMINE HCL 50 MG/ML IJ SOLN
12.5000 mg | Freq: Once | INTRAMUSCULAR | Status: AC
  Administered 2017-11-05: 12.5 mg via INTRAVENOUS
  Filled 2017-11-05: qty 1

## 2017-11-05 MED ORDER — METOCLOPRAMIDE HCL 5 MG/ML IJ SOLN
10.0000 mg | Freq: Once | INTRAMUSCULAR | Status: AC
  Administered 2017-11-05: 10 mg via INTRAVENOUS
  Filled 2017-11-05: qty 2

## 2017-11-05 MED ORDER — SODIUM CHLORIDE 0.9 % IV BOLUS
1000.0000 mL | Freq: Once | INTRAVENOUS | Status: AC
  Administered 2017-11-05: 1000 mL via INTRAVENOUS

## 2017-11-05 NOTE — ED Triage Notes (Signed)
Pt c/o intermittent headache since Wed. Reports when he takes Ibuprofen pain will go away.

## 2017-11-05 NOTE — Assessment & Plan Note (Signed)
Controlled at last visit.  Continue current regimen.

## 2017-11-05 NOTE — Discharge Instructions (Signed)
Take tylenol or excedrin for headaches.   See neurologist for follow up for headaches   Return to ER if you have worse headaches, numbness, weakness

## 2017-11-05 NOTE — ED Provider Notes (Signed)
Devin Novak DEPT Provider Note   CSN: [id number] Arrival date & time: 11/05/17  0705     History   Chief Complaint Chief Complaint  Patient presents with  . Headache    HPI Devin Novak is a 62 y.o. male hx of DM, HL, here presenting with headache.  Patient has been having left-sided headache for the last 3 days. Patient states that the pain is on the left temporal area and is sharp and occasionally radiate to the left posterior scalp.  Denies any associated blurry vision or double vision or weakness or numbness.  Patient states that he may have previous Bell's palsy but denies any facial numbness or asymmetry. Took some motrin but still had headaches so came for evaluation.   The history is provided by the patient.    Past Medical History:  Diagnosis Date  . Diabetes mellitus without complication ([place])   . ED (erectile dysfunction) 08/19/2016  . Hyperlipidemia     Patient Active Problem List   Diagnosis Date Noted  . Erectile dysfunction 08/04/2017  . Dystrophic nail 08/04/2017  . Diabetes mellitus type 2, uncomplicated ([place]) 22/97/9892  . Dyslipidemia 06/04/2015    Past Surgical History:  Procedure Laterality Date  . COLONOSCOPY  Not sure        Home Medications    Prior to Admission medications   Medication Sig Start Date End Date Taking? Authorizing Provider  atorvastatin (LIPITOR) 40 MG tablet Take 1 tablet (40 mg total) by mouth daily. 08/04/17   Harrison Mons, PA-C  blood glucose meter kit and supplies KIT Dispense based on patient and insurance preference. Use up to four times daily as directed. (FOR ICD-9 250.00, 250.01). 02/04/16   McVey, Gelene Mink, PA-C  glimepiride (AMARYL) 2 MG tablet TAKE ONE TABLET BY MOUTH ONCE DAILY BEFORE  BREAKFAST 08/04/17   Harrison Mons, PA-C  hydrOXYzine (ATARAX/VISTARIL) 25 MG tablet Take 1 tablet (25 mg total) by mouth every 8 (eight) hours as needed for itching. 11/02/17    Harrison Mons, PA-C  metFORMIN (GLUCOPHAGE) 1000 MG tablet Take 1 tablet (1,000 mg total) by mouth 2 (two) times daily with a meal. 11/02/17   Jeffery, Chelle, PA-C  ramipril (ALTACE) 10 MG capsule Take 1 capsule (10 mg total) by mouth daily. 08/04/17   Harrison Mons, PA-C    Family History Family History  Problem Relation Age of Onset  . Cancer Mother 11       colon cancer  . Diabetes Mother     Social History Social History   Tobacco Use  . Smoking status: Current Every Day Smoker    Types: Cigarettes  . Smokeless tobacco: Current User    Types: Chew  Substance Use Topics  . Alcohol use: Yes  . Drug use: Not on file     Allergies   Patient has no known allergies.   Review of Systems Review of Systems  Neurological: Positive for headaches.  All other systems reviewed and are negative.    Physical Exam Updated Vital Signs BP (!) 164/79 (BP Location: Left Arm)   Pulse 82   Temp 98.5 F (36.9 C) (Oral)   Resp 18   SpO2 100%   Physical Exam  Constitutional: He is oriented to person, place, and time. He appears well-developed.  HENT:  Head: Normocephalic.  ? Bell's palsy L side. No temporal area tenderness   Eyes: Pupils are equal, round, and reactive to light.  Neck: Normal range of motion.  Cardiovascular: Normal  rate, regular rhythm and normal heart sounds.  Pulmonary/Chest: Effort normal and breath sounds normal. No respiratory distress.  Abdominal: Soft. Bowel sounds are normal.  Neurological: He is alert and oriented to person, place, and time. He has normal strength.  Nl finger to nose, nl gait, nl strength throughout   Skin: Skin is warm.  Psychiatric: He has a normal mood and affect.  Nursing note and vitals reviewed.    ED Treatments / Results  Labs (all labs ordered are listed, but only abnormal results are displayed) Labs Reviewed  CBC WITH DIFFERENTIAL/PLATELET  BASIC METABOLIC PANEL    EKG None  Radiology No results  found.  Procedures Procedures (including critical care time)  Medications Ordered in ED Medications  sodium chloride 0.9 % bolus 1,000 mL (has no administration in time range)  metoCLOPramide (REGLAN) injection 10 mg (has no administration in time range)  diphenhydrAMINE (BENADRYL) injection 12.5 mg (has no administration in time range)     Initial Impression / Assessment and Plan / ED Course  I have reviewed the triage vital signs and the nursing notes.  Pertinent labs & imaging results that were available during my care of the patient were reviewed by me and considered in my medical decision making (see chart for details).    [name] is a 62 y.o. male here with headaches. ? Bell's palsy but patient states that its chronic. Given new onset headaches and hypertension, will get CT head to r/o bleed. Will get labs and give migraine cocktail.  1:21 PM CT head unremarkable. Labs unremarkable. Headache improved. Will dc home and have him follow up with neurologist    Final Clinical Impressions(s) / ED Diagnoses   Final diagnoses:  None    ED Discharge Orders    None       Drenda Freeze, MD 11/05/17 1321

## 2017-11-05 NOTE — ED Notes (Signed)
Pt ambulates to CT

## 2017-11-06 ENCOUNTER — Encounter: Payer: Self-pay | Admitting: Family Medicine

## 2017-11-16 ENCOUNTER — Encounter: Payer: Self-pay | Admitting: Neurology

## 2017-11-16 ENCOUNTER — Ambulatory Visit: Payer: Commercial Managed Care - PPO | Admitting: Neurology

## 2017-11-16 DIAGNOSIS — G510 Bell's palsy: Secondary | ICD-10-CM | POA: Diagnosis not present

## 2017-11-16 HISTORY — DX: Bell's palsy: G51.0

## 2017-11-16 NOTE — Progress Notes (Signed)
Reason for visit: Left-sided headache  Referring physician: [place]  Devin Novak is a 62 y.o. male  History of present illness:  [name] is a 62 year old right-handed black male with a history of diabetes.  The patient has been to the emergency room on 05 Nov 2017 with a history of onset of left-sided headaches that began about 3 weeks ago.  The patient indicates that the first thing he began to notice is that the left parietal area the head was sore to touch.  Eventually, he began having sharp shooting pains in the back of the head into the occipital area on the left.  He denied any neck pain or neck discomfort.  He has not had any hearing changes or changes in speech or swallowing.  He denies any numbness of the arms or legs or face.  He has not had any weakness of the extremities or difficulty with coordination of the arms or legs or difficulty with balance.  He denies issues controlling the bowels or the bladder.  As time has gone on, he has had gradual improvement of the headache issue.  He was seen through the emergency room and a CT scan of the brain was done and was unremarkable.  The patient is sent to this office for further evaluation.  The patient denies any double vision or loss of vision or any visual changes.  Past Medical History:  Diagnosis Date  . Diabetes mellitus without complication ([place])   . ED (erectile dysfunction) 08/19/2016  . Hyperlipidemia     Past Surgical History:  Procedure Laterality Date  . COLONOSCOPY  Not sure    Family History  Problem Relation Age of Onset  . Cancer Mother 31       colon cancer  . Diabetes Mother     Social history:  reports that he has been smoking cigarettes.  His smokeless tobacco use includes chew. He reports that he drinks alcohol. His drug history is not on file.  Medications:  Prior to Admission medications   Medication Sig Start Date End Date Taking? Authorizing Provider    aspirin-acetaminophen-caffeine (EXCEDRIN MIGRAINE) [phone] MG tablet Take 1 tablet by mouth every 6 (six) hours as needed for headache. 11/05/17  Yes Drenda Freeze, MD  atorvastatin (LIPITOR) 40 MG tablet Take 1 tablet (40 mg total) by mouth daily. 08/04/17  Yes Jeffery, Chelle, PA-C  blood glucose meter kit and supplies KIT Dispense based on patient and insurance preference. Use up to four times daily as directed. (FOR ICD-9 250.00, 250.01). 02/04/16  Yes McVey, Gelene Mink, PA-C  glimepiride (AMARYL) 2 MG tablet TAKE ONE TABLET BY MOUTH ONCE DAILY BEFORE  BREAKFAST 08/04/17  Yes Jeffery, Chelle, PA-C  hydrOXYzine (ATARAX/VISTARIL) 25 MG tablet Take 1 tablet (25 mg total) by mouth every 8 (eight) hours as needed for itching. 11/02/17  Yes Jeffery, Chelle, PA-C  metFORMIN (GLUCOPHAGE) 1000 MG tablet Take 1 tablet (1,000 mg total) by mouth 2 (two) times daily with a meal. 11/02/17  Yes Jeffery, Chelle, PA-C  ramipril (ALTACE) 10 MG capsule Take 1 capsule (10 mg total) by mouth daily. 08/04/17  Yes Jeffery, Chelle, PA-C     No Known Allergies  ROS:  Out of a complete 14 system review of symptoms, the patient complains only of the following symptoms, and all other reviewed systems are negative.  Headache  Blood pressure (!) 153/89, pulse 82, height 5' 11"  (1.803 m), weight 219 lb (99.3 kg).  Physical Exam  General: The patient is alert and cooperative at the time of the examination.  Eyes: Pupils are equal, round, and reactive to light. Discs are flat bilaterally.  Neck: The neck is supple, no carotid bruits are noted.  Respiratory: The respiratory examination is clear.  Cardiovascular: The cardiovascular examination reveals a regular rate and rhythm, no obvious murmurs or rubs are noted.  Skin: Extremities are without significant edema.  Neurologic Exam  Mental status: The patient is alert and oriented x 3 at the time of the examination. The patient has apparent normal  recent and remote memory, with an apparently normal attention span and concentration ability.  Cranial nerves: Facial symmetry is not present. There is good sensation of the face to pinprick and soft touch bilaterally. The strength of the muscles to head turning and shoulder shrug are normal bilaterally.  The patient has mild weakness of the left side of the face.  Eye blink is decreased on the left.  Speech is well enunciated, no aphasia or dysarthria is noted. Extraocular movements are full. Visual fields are full. The tongue is midline, and the patient has symmetric elevation of the soft palate. No obvious hearing deficits are noted.  Motor: The motor testing reveals 5 over 5 strength of all 4 extremities. Good symmetric motor tone is noted throughout.  Sensory: Sensory testing is intact to pinprick, soft touch, vibration sensation, and position sense on all 4 extremities. No evidence of extinction is noted.  Coordination: Cerebellar testing reveals good finger-nose-finger and heel-to-shin bilaterally.  Gait and station: Gait is normal. Tandem gait is normal. Romberg is negative. No drift is seen.  Reflexes: Deep tendon reflexes are symmetric, but are slightly depressed bilaterally. Toes are downgoing bilaterally.    CT head 11/05/17:  IMPRESSION: 1. No significant intracranial abnormality is identified on CT scan to explain the patient's recent headaches.  * CT scan images were reviewed online. I agree with the written report.    Assessment/Plan:  1.  Bell's palsy, left  The patient has a headache syndrome that appears to mimic a left occipital neuralgia with sharp jabbing pains in the left side of the head posteriorly.  However, the patient began having these headaches in conjunction with onset of a left-sided Bell's palsy.  The weakness of the left face is mild but is definitely present.  The patient will use methylcellulose eyedrops as much as needed.  He should have a good  prognosis in the future for full recovery.  He will follow-up in about 3 months.  He will call if he has any questions or concerns.  Jill Alexanders MD 11/16/2017 4:08 PM  [facility] [address] [place], [place] [address]  Phone [phone] Fax [phone]

## 2017-11-16 NOTE — Patient Instructions (Signed)
Bell Palsy, Adult Bell palsy is a short-term inability to move muscles in part of the face. The inability to move (paralysis) results from inflammation or compression of the facial nerve, which travels along the skull and under the ear to the side of the face (7th cranial nerve). This nerve is responsible for facial movements that include blinking, closing the eyes, smiling, and frowning. What are the causes? The exact cause of this condition is not known. It may be caused by an infection from a virus, such as the chickenpox (herpes zoster), Epstein-Barr, or mumps virus. What increases the risk? You are more likely to develop this condition if:  You are pregnant.  You have diabetes.  You have had a recent infection in your nose, throat, or airways (upper respiratory infection).  You have a weakened body defense system (immune system).  You have had a facial injury, such as a fracture.  You have a family history of Bell palsy.  What are the signs or symptoms? Symptoms of this condition include:  Weakness on one side of the face.  Drooping eyelid and corner of the mouth.  Excessive tearing in one eye.  Difficulty closing the eyelid.  Dry eye.  Drooling.  Dry mouth.  Changes in taste.  Change in facial appearance.  Pain behind one ear.  Ringing in one or both ears.  Sensitivity to sound in one ear.  Facial twitching.  Headache.  Impaired speech.  Dizziness.  Difficulty eating or drinking.  Most of the time, only one side of the face is affected. Rarely, Bell palsy affects the whole face. How is this diagnosed? This condition is diagnosed based on:  Your symptoms.  Your medical history.  A physical exam.  You may also have to see health care providers who specialize in disorders of the nerves (neurologist) or diseases and conditions of the eye (ophthalmologist). You may have tests, such as:  A test to check for nerve damage (electromyogram).  Imaging  studies, such as CT or MRI scans.  Blood tests.  How is this treated? This condition affects every person differently. Sometimes symptoms go away without treatment within a couple weeks. If treatment is needed, it varies from person to person. The goal of treatment is to reduce inflammation and protect the eye from damage. Treatment for Bell palsy may include:  Medicines, such as: ? Steroids to reduce swelling and inflammation. ? Antiviral drugs. ? Pain relievers, including aspirin, acetaminophen, or ibuprofen.  Eye drops or ointment to keep your eye moist.  Eye protection, if you cannot close your eye.  Exercises or massage to regain muscle strength and function (physical therapy).  Follow these instructions at home:  Take over-the-counter and prescription medicines only as told by your health care provider.  If your eye is affected: ? Keep your eye moist with eye drops or ointment as told by your health care provider. ? Follow instructions for eye care and protection as told by your health care provider.  Do any physical therapy exercises as told by your health care provider.  Keep all follow-up visits as told by your health care provider. This is important. Contact a health care provider if:  You have a fever.  Your symptoms do not get better within 2-3 weeks, or your symptoms get worse.  Your eye is red, irritated, or painful.  You have new symptoms. Get help right away if:  You have weakness or numbness in a part of your body other than your face.    You have trouble swallowing.  You develop neck pain or stiffness.  You develop dizziness or shortness of breath. Summary  Bell palsy is a short-term inability to move muscles in part of the face. The inability to move (paralysis) results from inflammation or compression of the facial nerve.  This condition affects every person differently. Sometimes symptoms go away without treatment within a couple weeks.  If  treatment is needed, it varies from person to person. The goal of treatment is to reduce inflammation and protect the eye from damage.  Contact your health care provider if your symptoms do not get better within 2-3 weeks, or your symptoms get worse. This information is not intended to replace advice given to you by your health care provider. Make sure you discuss any questions you have with your health care provider. Document Released: 05/30/2005 Document Revised: 08/02/2016 Document Reviewed: 08/02/2016 Elsevier Interactive Patient Education  2018 Elsevier Inc.  

## 2018-03-21 NOTE — Progress Notes (Signed)
[facility]  PATIENT: Devin Novak DOB: [birth year]   REASON FOR VISIT: Follow-up for Bell's palsy HISTORY FROM: Patient alone at visit    [place] 10/10/2019CM Devin Novak, 62 year old male returns for follow-up with history of diabetes and Bell's palsy on the left.  He also had a headache associated with this.  His mild left-sided weakness has cleared.  He denies any headache.  He denies any speech or swallowing problems or  neck  discomfort.  He denies any visual symptoms.  He returns for reevaluation 11/16/17 KWMr. Devin Novak is a 62 year old right-handed black male with a history of diabetes.  The patient has been to the emergency room on 05 Nov 2017 with a history of onset of left-sided headaches that began about 3 weeks ago.  The patient indicates that the first thing he began to notice is that the left parietal area the head was sore to touch.  Eventually, he began having sharp shooting pains in the back of the head into the occipital area on the left.  He denied any neck pain or neck discomfort.  He has not had any hearing changes or changes in speech or swallowing.  He denies any numbness of the arms or legs or face.  He has not had any weakness of the extremities or difficulty with coordination of the arms or legs or difficulty with balance.  He denies issues controlling the bowels or the bladder.  As time has gone on, he has had gradual improvement of the headache issue.  He was seen through the emergency room and a CT scan of the brain was done and was unremarkable.  The patient is sent to this office for further evaluation.  The patient denies any double vision or loss of vision or any visual changes.   REVIEW OF SYSTEMS: Full 14 system review of systems performed and notable only for those listed, all others are neg:  Constitutional: neg  Cardiovascular: neg Ear/Nose/Throat: neg  Skin: neg Eyes: neg Respiratory:  neg Gastroitestinal: neg  Hematology/Lymphatic: neg  Endocrine: neg Musculoskeletal:neg Allergy/Immunology: neg Neurological: neg Psychiatric: neg Sleep : neg   ALLERGIES: No Known Allergies  HOME MEDICATIONS: Outpatient Medications Prior to Visit  Medication Sig Dispense Refill  . aspirin-acetaminophen-caffeine (EXCEDRIN MIGRAINE) 250-250-65 MG tablet Take 1 tablet by mouth every 6 (six) hours as needed for headache. 20 tablet 0  . atorvastatin (LIPITOR) 40 MG tablet Take 1 tablet (40 mg total) by mouth daily. 90 tablet 3  . blood glucose meter kit and supplies KIT Dispense based on patient and insurance preference. Use up to four times daily as directed. (FOR ICD-9 250.00, 250.01). 1 each 0  . glimepiride (AMARYL) 2 MG tablet TAKE ONE TABLET BY MOUTH ONCE DAILY BEFORE  BREAKFAST 90 tablet 3  . hydrOXYzine (ATARAX/VISTARIL) 25 MG tablet Take 1 tablet (25 mg total) by mouth every 8 (eight) hours as needed for itching. 30 tablet 0  . metFORMIN (GLUCOPHAGE) 1000 MG tablet Take 1 tablet (1,000 mg total) by mouth 2 (two) times daily with a meal. 180 tablet 3  . ramipril (ALTACE) 10 MG capsule Take 1 capsule (10 mg total) by mouth daily. 90 capsule 3   No facility-administered medications prior to visit.     PAST MEDICAL HISTORY: Past Medical History:  Diagnosis Date  . Bell's palsy 11/16/2017   left  . Diabetes mellitus without complication ([place])   . ED (erectile dysfunction) 08/19/2016  . Hyperlipidemia     PAST SURGICAL  HISTORY: Past Surgical History:  Procedure Laterality Date  . COLONOSCOPY  Not sure    FAMILY HISTORY: Family History  Problem Relation Age of Onset  . Cancer Mother 21       colon cancer  . Diabetes Mother     SOCIAL HISTORY: Social History   Socioeconomic History  . Marital status: Married    Spouse name: Lorriane Shire  . Number of children: 1  . Years of education: Not on file  . Highest education level: Not on file  Occupational History  .  Occupation: Engineer, building services  Social Needs  . Financial resource strain: Not on file  . Food insecurity:    Worry: Not on file    Inability: Not on file  . Transportation needs:    Medical: Not on file    Non-medical: Not on file  Tobacco Use  . Smoking status: Current Every Day Smoker    Types: Cigarettes  . Smokeless tobacco: Current User    Types: Chew  Substance and Sexual Activity  . Alcohol use: Yes  . Drug use: Not on file  . Sexual activity: Yes  Lifestyle  . Physical activity:    Days per week: Not on file    Minutes per session: Not on file  . Stress: Not on file  Relationships  . Social connections:    Talks on phone: Not on file    Gets together: Not on file    Attends religious service: Not on file    Active member of club or organization: Not on file    Attends meetings of clubs or organizations: Not on file    Relationship status: Not on file  . Intimate partner violence:    Fear of current or ex partner: Not on file    Emotionally abused: Not on file    Physically abused: Not on file    Forced sexual activity: Not on file  Other Topics Concern  . Not on file  Social History Narrative   Marital status: married to wife Lorriane Shire since 2013     Children:  1 son; 4 grandchildren in area      Lives: with wife      Employment:  Environmental manager; worked for [employer] for 30 years      Tobacco:  None      Alcohol: beer 2 per day      Exercise:  none     PHYSICAL EXAM  Vitals:   03/22/18 1353  BP: (!) 143/83  Pulse: 94  Weight: 213 lb 12.8 oz (97 kg)  Height: _0  (1.803 m)   Body mass index is 29.82 kg/m.  Generalized: Well developed, in no acute distress  Musculoskeletal: No deformity Skin no rash or edema  Neurological examination   Mentation: Alert oriented to time, place, history taking. Attention span and concentration appropriate. Recent and remote memory intact.  Follows all commands speech and language  fluent.   Cranial nerve II-XII: Pupils were equal round reactive to light extraocular movements were full, visual field were full on confrontational test. Facial sensation and strength were normal. hearing was intact to finger rubbing bilaterally. Uvula tongue midline. head turning and shoulder shrug were normal and symmetric.Tongue protrusion into cheek strength was normal. Motor: normal bulk and tone, full strength in the BUE, BLE,  Sensory: normal and symmetric to light touch, pinprick, and  Vibration, in the upper and lower extremities Coordination: finger-nose-finger, heel-to-shin bilaterally, no dysmetria Reflexes: Symmetric upper and  lower plantar responses were flexor bilaterally. Gait and Station: Rising up from seated position without assistance, normal stance,  moderate stride, good arm swing, smooth turning, able to perform tiptoe, and heel walking without difficulty. Tandem gait is steady  DIAGNOSTIC DATA (LABS, IMAGING, TESTING) - I reviewed patient records, labs, notes, testing and imaging myself where available.  Lab Results  Component Value Date   WBC 6.9 11/05/2017   HGB 15.1 11/05/2017   HCT 45.3 11/05/2017   MCV 87.8 11/05/2017   PLT 385 11/05/2017      Component Value Date/Time   NA 143 11/05/2017 1151   NA 137 11/02/2017 0936   K 4.5 11/05/2017 1151   CL 108 11/05/2017 1151   CO2 25 11/05/2017 1151   GLUCOSE 202 (H) 11/05/2017 1151   BUN 14 11/05/2017 1151   BUN 13 11/02/2017 0936   CREATININE 1.11 11/05/2017 1151   CREATININE 0.99 02/04/2016 0918   CALCIUM 9.6 11/05/2017 1151   PROT 7.3 11/02/2017 0936   ALBUMIN 4.5 11/02/2017 0936   AST 12 11/02/2017 0936   ALT 16 11/02/2017 0936   ALKPHOS 107 11/02/2017 0936   BILITOT 0.6 11/02/2017 0936   GFRNONAA >60 11/05/2017 1151   GFRAA >60 11/05/2017 1151   Lab Results  Component Value Date   CHOL 131 08/04/2017   HDL 48 08/04/2017   LDLCALC 66 08/04/2017   TRIG 83 08/04/2017   CHOLHDL 2.7 08/04/2017    Lab Results  Component Value Date   HGBA1C 6.6 (H) 11/02/2017   No results found for: [id number] Lab Results  Component Value Date   TSH 3.040 08/04/2017      ASSESSMENT AND PLAN  62 y.o. year old male  has a past medical history of Bell's palsy on the left associated with a headache syndrome however his Bell's palsy has cleared without further headaches.   PLAN: Bells palsy has resolved Encourage moderate exercise for overall health and well-being.  This would also help his diabetes No follow up planned Dennie Bible, [facility], [facility], APRN  [facility] [address] [place], [place] [address] [phone]

## 2018-03-22 ENCOUNTER — Ambulatory Visit: Payer: Commercial Managed Care - PPO | Admitting: Nurse Practitioner

## 2018-03-22 ENCOUNTER — Encounter: Payer: Self-pay | Admitting: Nurse Practitioner

## 2018-03-22 VITALS — BP 143/83 | HR 94 | Ht 71.0 in | Wt 213.8 lb

## 2018-03-22 DIAGNOSIS — G510 Bell's palsy: Secondary | ICD-10-CM

## 2018-03-22 NOTE — Progress Notes (Signed)
I have read the note, and I agree with the clinical assessment and plan.  Charles K Willis   

## 2018-03-22 NOTE — Patient Instructions (Signed)
Bells palsy has resolved No follow up planned

## 2018-07-13 ENCOUNTER — Encounter: Payer: Self-pay | Admitting: Family Medicine

## 2018-07-13 ENCOUNTER — Other Ambulatory Visit: Payer: Self-pay

## 2018-07-13 ENCOUNTER — Ambulatory Visit: Payer: Commercial Managed Care - PPO | Admitting: Family Medicine

## 2018-07-13 VITALS — BP 151/77 | HR 79 | Temp 98.2°F | Resp 16 | Ht 71.0 in | Wt 222.0 lb

## 2018-07-13 DIAGNOSIS — J22 Unspecified acute lower respiratory infection: Secondary | ICD-10-CM

## 2018-07-13 DIAGNOSIS — Z23 Encounter for immunization: Secondary | ICD-10-CM | POA: Diagnosis not present

## 2018-07-13 DIAGNOSIS — R05 Cough: Secondary | ICD-10-CM

## 2018-07-13 DIAGNOSIS — R059 Cough, unspecified: Secondary | ICD-10-CM

## 2018-07-13 MED ORDER — AZITHROMYCIN 250 MG PO TABS
ORAL_TABLET | ORAL | 0 refills | Status: DC
Start: 2018-07-13 — End: 2024-02-29

## 2018-07-13 NOTE — Progress Notes (Signed)
Subjective:    Patient ID: Devin Novak, male    DOB: [birth year], 63 y.o.   MRN: [id number]  HPI Devin Novak is a 63 y.o. male Presents today for: Chief Complaint  Patient presents with  . Sinus Problem    pt states his nose runs at times/ x 3 wks  . Nasal Congestion    x 3wks  . Cough    x 3wks, comes and goes   Nasal congestion with occasional runny nose for the past month but primarily some congestion in chest.  Yellow phlegm in past few weeks. No fever. Cough off and on plast few weeks. No dyspnea. No hx of asthma/copd.  Chew tobacco, no smoking.  No insomnia from cough. mucinex doing ok during the day.   Tx: otc mucinex, nyquil     Patient Active Problem List   Diagnosis Date Noted  . Erectile dysfunction 08/04/2017  . Dystrophic nail 08/04/2017  . Diabetes mellitus type 2, uncomplicated ([place]) 75/64/3329  . Dyslipidemia 06/04/2015   Past Medical History:  Diagnosis Date  . Bell's palsy 11/16/2017   left  . Diabetes mellitus without complication ([place])   . ED (erectile dysfunction) 08/19/2016  . Hyperlipidemia    Past Surgical History:  Procedure Laterality Date  . COLONOSCOPY  Not sure   No Known Allergies Prior to Admission medications   Medication Sig Start Date End Date Taking? Authorizing Provider  aspirin-acetaminophen-caffeine (EXCEDRIN MIGRAINE) [phone] MG tablet Take 1 tablet by mouth every 6 (six) hours as needed for headache. 11/05/17  Yes Drenda Freeze, MD  atorvastatin (LIPITOR) 40 MG tablet Take 1 tablet (40 mg total) by mouth daily. 08/04/17  Yes Jeffery, Chelle, PA  blood glucose meter kit and supplies KIT Dispense based on patient and insurance preference. Use up to four times daily as directed. (FOR ICD-9 250.00, 250.01). 02/04/16  Yes McVey, Gelene Mink, PA-C  glimepiride (AMARYL) 2 MG tablet TAKE ONE TABLET BY MOUTH ONCE DAILY BEFORE  BREAKFAST 08/04/17  Yes Harrison Mons, PA  hydrOXYzine (ATARAX/VISTARIL) 25 MG tablet  Take 1 tablet (25 mg total) by mouth every 8 (eight) hours as needed for itching. 11/02/17  Yes Harrison Mons, PA  metFORMIN (GLUCOPHAGE) 1000 MG tablet Take 1 tablet (1,000 mg total) by mouth 2 (two) times daily with a meal. 11/02/17  Yes Jeffery, Chelle, PA  ramipril (ALTACE) 10 MG capsule Take 1 capsule (10 mg total) by mouth daily. 08/04/17  Yes Harrison Mons, PA   Social History   Socioeconomic History  . Marital status: Married    Spouse name: Lorriane Shire  . Number of children: 1  . Years of education: Not on file  . Highest education level: Not on file  Occupational History  . Occupation: Engineer, building services  Social Needs  . Financial resource strain: Not on file  . Food insecurity:    Worry: Not on file    Inability: Not on file  . Transportation needs:    Medical: Not on file    Non-medical: Not on file  Tobacco Use  . Smoking status: Current Every Day Smoker    Types: Cigarettes  . Smokeless tobacco: Current User    Types: Chew  Substance and Sexual Activity  . Alcohol use: Yes  . Drug use: Not on file  . Sexual activity: Yes  Lifestyle  . Physical activity:    Days per week: Not on file    Minutes per session: Not on file  . Stress: Not on  file  Relationships  . Social connections:    Talks on phone: Not on file    Gets together: Not on file    Attends religious service: Not on file    Active member of club or organization: Not on file    Attends meetings of clubs or organizations: Not on file    Relationship status: Not on file  . Intimate partner violence:    Fear of current or ex partner: Not on file    Emotionally abused: Not on file    Physically abused: Not on file    Forced sexual activity: Not on file  Other Topics Concern  . Not on file  Social History Narrative   Marital status: married to wife Lorriane Shire since 2013     Children:  1 son; 4 grandchildren in area      Lives: with wife      Employment:  Environmental manager;  worked for [employer] for 30 years      Tobacco:  None      Alcohol: beer 2 per day      Exercise:  none    Review of Systems Per HPI.     Objective:   Physical Exam Vitals signs reviewed.  Constitutional:      Appearance: He is well-developed.  HENT:     Head: Normocephalic and atraumatic.     Right Ear: Tympanic membrane, ear canal and external ear normal.     Left Ear: Tympanic membrane, ear canal and external ear normal.     Nose: No rhinorrhea.     Mouth/Throat:     Pharynx: No oropharyngeal exudate or posterior oropharyngeal erythema.  Eyes:     Conjunctiva/sclera: Conjunctivae normal.     Pupils: Pupils are equal, round, and reactive to light.  Neck:     Musculoskeletal: Neck supple.  Cardiovascular:     Rate and Rhythm: Normal rate and regular rhythm.     Heart sounds: Normal heart sounds. No murmur.  Pulmonary:     Effort: Pulmonary effort is normal.     Breath sounds: Normal breath sounds. No wheezing, rhonchi or rales.  Abdominal:     Palpations: Abdomen is soft.     Tenderness: There is no abdominal tenderness.  Lymphadenopathy:     Cervical: No cervical adenopathy.  Skin:    General: Skin is warm and dry.     Findings: No rash.  Neurological:     Mental Status: He is alert and oriented to person, place, and time.  Psychiatric:        Behavior: Behavior normal.    There were no vitals filed for this visit.     Assessment & Plan:    Devin Novak is a 63 y.o. male LRTI (lower respiratory tract infection) - Plan: azithromycin (ZITHROMAX) 250 MG tablet  Need for influenza vaccination - Plan: Flu Vaccine QUAD 36+ mos IM  Cough - Plan: azithromycin (ZITHROMAX) 250 MG tablet Persistent waxing and waning cough with discolored phlegm.  Low respiratory infection/bronchitis versus less likely pneumonia.  Lungs are clear, reassuring vitals.  -Start azithromycin, Z-Pak.  Potential side effects and risk discussed, continue Mucinex for symptom control and  RTC precautions given.    Meds ordered this encounter  Medications  . azithromycin (ZITHROMAX) 250 MG tablet    Sig: Take 2 pills by mouth on day 1, then 1 pill by mouth per day on days 2 through 5.    Dispense:  6 tablet  Refill:  0   Patient Instructions   Continue over the counter mucinex, drink plenty of fluids.  Start antibiotic for possible lower respiratory infection/bronchitis, but if your cough is not improving in the next 2 weeks, or any worsening symptoms, return for recheck and possible x-ray.  Thank you for coming in today   Cough, Adult  Coughing is a reflex that clears your throat and your airways. Coughing helps to heal and protect your lungs. It is normal to cough occasionally, but a cough that happens with other symptoms or lasts a long time may be a sign of a condition that needs treatment. A cough may last only 2-3 weeks (acute), or it may last longer than 8 weeks (chronic). What are the causes? Coughing is commonly caused by:  Breathing in substances that irritate your lungs.  A viral or bacterial respiratory infection.  Allergies.  Asthma.  Postnasal drip.  Smoking.  Acid backing up from the stomach into the esophagus (gastroesophageal reflux).  Certain medicines.  Chronic lung problems, including COPD (or rarely, lung cancer).  Other medical conditions such as heart failure. Follow these instructions at home: Pay attention to any changes in your symptoms. Take these actions to help with your discomfort:  Take medicines only as told by your health care provider. ? If you were prescribed an antibiotic medicine, take it as told by your health care provider. Do not stop taking the antibiotic even if you start to feel better. ? Talk with your health care provider before you take a cough suppressant medicine.  Drink enough fluid to keep your urine clear or pale yellow.  If the air is dry, use a cold steam vaporizer or humidifier in your bedroom or  your home to help loosen secretions.  Avoid anything that causes you to cough at work or at home.  If your cough is worse at night, try sleeping in a semi-upright position.  Avoid cigarette smoke. If you smoke, quit smoking. If you need help quitting, ask your health care provider.  Avoid caffeine.  Avoid alcohol.  Rest as needed. Contact a health care provider if:  You have new symptoms.  You cough up pus.  Your cough does not get better after 2-3 weeks, or your cough gets worse.  You cannot control your cough with suppressant medicines and you are losing sleep.  You develop pain that is getting worse or pain that is not controlled with pain medicines.  You have a fever.  You have unexplained weight loss.  You have night sweats. Get help right away if:  You cough up blood.  You have difficulty breathing.  Your heartbeat is very fast. This information is not intended to replace advice given to you by your health care provider. Make sure you discuss any questions you have with your health care provider. Document Released: 11/26/2010 Document Revised: 11/05/2015 Document Reviewed: 08/06/2014 Elsevier Interactive Patient Education  [employer].   If you have lab work done today you will be contacted with your lab results within the next 2 weeks.  If you have not heard from [place] then please contact us. The fastest way to get your results is to register for My Chart.   IF you received an x-ray today, you will receive an invoice from [facility]. Please contact [facility] at [phone] with questions or concerns regarding your invoice.   IF you received labwork today, you will receive an invoice from [facility]. Please contact [facility] at [phone] with  questions or concerns regarding your invoice.   Our billing staff will not be able to assist you with questions regarding bills from these companies.  You will be contacted with the lab results  as soon as they are available. The fastest way to get your results is to activate your My Chart account. Instructions are located on the last page of this paperwork. If you have not heard from [place] regarding the results in 2 weeks, please contact this office.       Signed,   Merri Ray, MD [facility].  07/13/18 10:52 AM

## 2018-07-13 NOTE — Patient Instructions (Signed)
Continue over the counter mucinex, drink plenty of fluids.  Start antibiotic for possible lower respiratory infection/bronchitis, but if your cough is not improving in the next 2 weeks, or any worsening symptoms, return for recheck and possible x-ray.  Thank you for coming in today   Cough, Adult  Coughing is a reflex that clears your throat and your airways. Coughing helps to heal and protect your lungs. It is normal to cough occasionally, but a cough that happens with other symptoms or lasts a long time may be a sign of a condition that needs treatment. A cough may last only 2-3 weeks (acute), or it may last longer than 8 weeks (chronic). What are the causes? Coughing is commonly caused by:  Breathing in substances that irritate your lungs.  A viral or bacterial respiratory infection.  Allergies.  Asthma.  Postnasal drip.  Smoking.  Acid backing up from the stomach into the esophagus (gastroesophageal reflux).  Certain medicines.  Chronic lung problems, including COPD (or rarely, lung cancer).  Other medical conditions such as heart failure. Follow these instructions at home: Pay attention to any changes in your symptoms. Take these actions to help with your discomfort:  Take medicines only as told by your health care provider. ? If you were prescribed an antibiotic medicine, take it as told by your health care provider. Do not stop taking the antibiotic even if you start to feel better. ? Talk with your health care provider before you take a cough suppressant medicine.  Drink enough fluid to keep your urine clear or pale yellow.  If the air is dry, use a cold steam vaporizer or humidifier in your bedroom or your home to help loosen secretions.  Avoid anything that causes you to cough at work or at home.  If your cough is worse at night, try sleeping in a semi-upright position.  Avoid cigarette smoke. If you smoke, quit smoking. If you need help quitting, ask your health  care provider.  Avoid caffeine.  Avoid alcohol.  Rest as needed. Contact a health care provider if:  You have new symptoms.  You cough up pus.  Your cough does not get better after 2-3 weeks, or your cough gets worse.  You cannot control your cough with suppressant medicines and you are losing sleep.  You develop pain that is getting worse or pain that is not controlled with pain medicines.  You have a fever.  You have unexplained weight loss.  You have night sweats. Get help right away if:  You cough up blood.  You have difficulty breathing.  Your heartbeat is very fast. This information is not intended to replace advice given to you by your health care provider. Make sure you discuss any questions you have with your health care provider. Document Released: 11/26/2010 Document Revised: 11/05/2015 Document Reviewed: 08/06/2014 Elsevier Interactive Patient Education  [employer].   If you have lab work done today you will be contacted with your lab results within the next 2 weeks.  If you have not heard from [place] then please contact us. The fastest way to get your results is to register for My Chart.   IF you received an x-ray today, you will receive an invoice from [facility]. Please contact [facility] at [phone] with questions or concerns regarding your invoice.   IF you received labwork today, you will receive an invoice from [place]. Please contact [facility] at [phone] with questions or concerns regarding your invoice.   Our  billing staff will not be able to assist you with questions regarding bills from these companies.  You will be contacted with the lab results as soon as they are available. The fastest way to get your results is to activate your My Chart account. Instructions are located on the last page of this paperwork. If you have not heard from us regarding the results in 2 weeks, please contact this office.      

## 2018-07-14 ENCOUNTER — Encounter: Payer: Self-pay | Admitting: Family Medicine

## 2019-02-28 IMAGING — CT CT HEAD W/O CM
3 series · 14 of 47 positions shown, 16 images · non-contrast
Comparison: None.

CLINICAL DATA: Intermittent headaches since [REDACTED]

EXAM:
CT HEAD WITHOUT CONTRAST
TECHNIQUE: Contiguous axial images were obtained from the base of the skull
through the vertex without intravenous contrast.

[Series 2: head wo · axial · 0.47mm/px · z∈[-89,+46]mm · 8 of 33 slices shown, 10 images]
[im 3/33  brain]
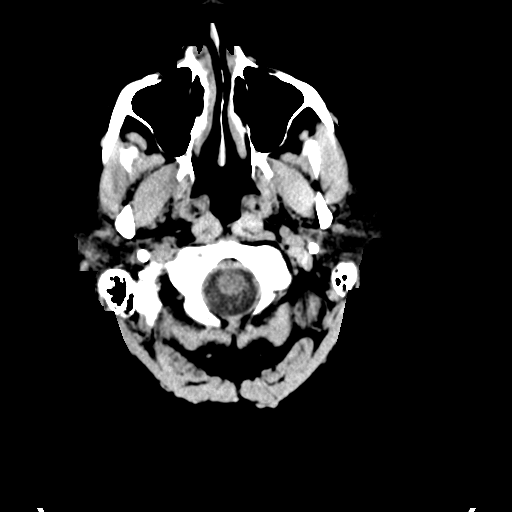
[im 3/33  bone]
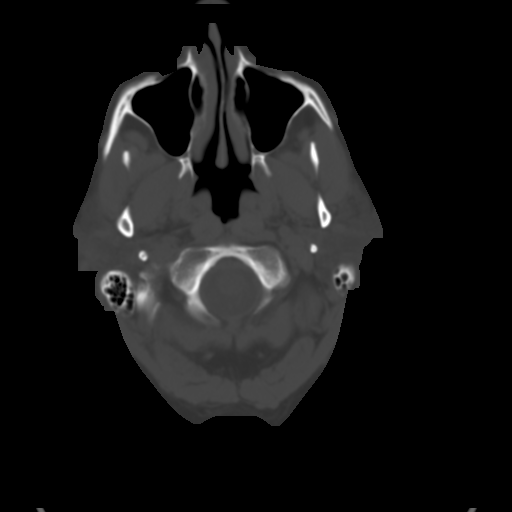
[im 7/33  brain]
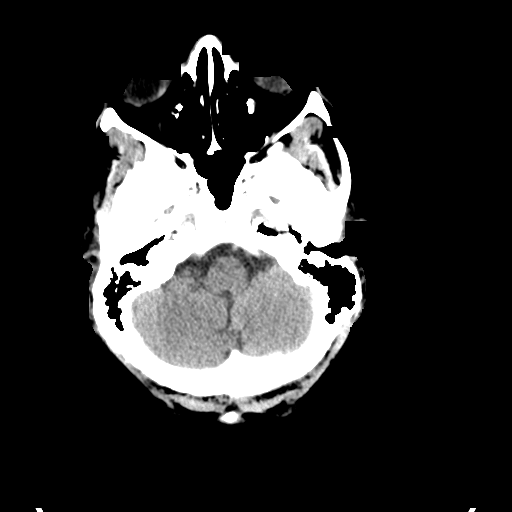
[im 10/33  brain]
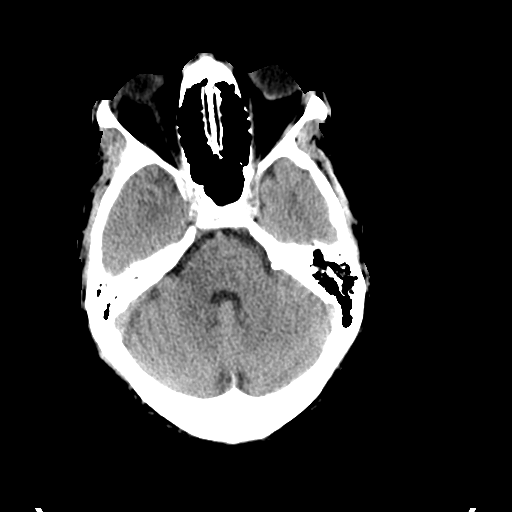
[im 15/33  brain]
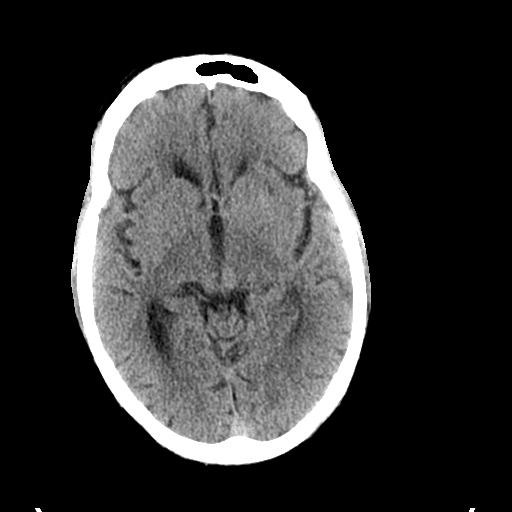
[im 18/33  brain]
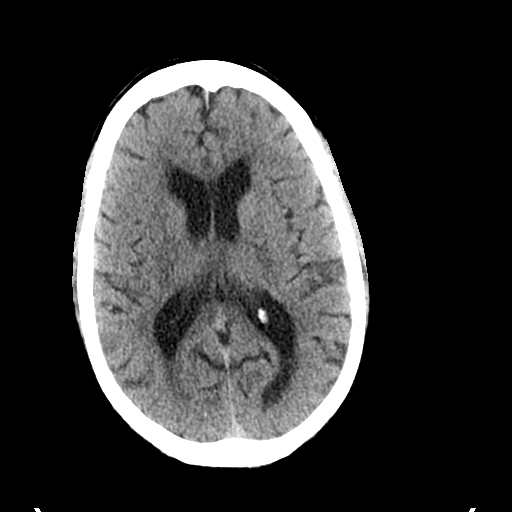
[im 18/33  bone]
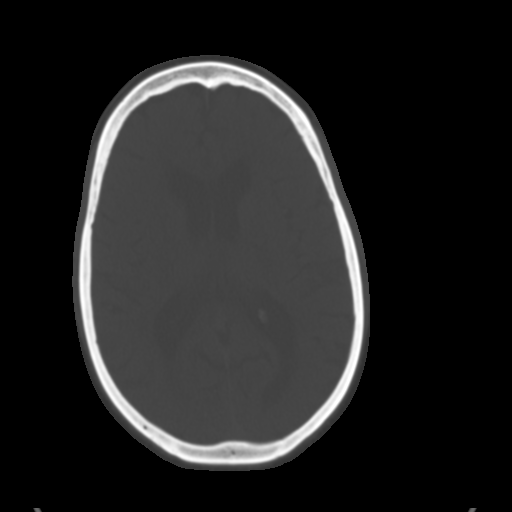
[im 23/33  brain]
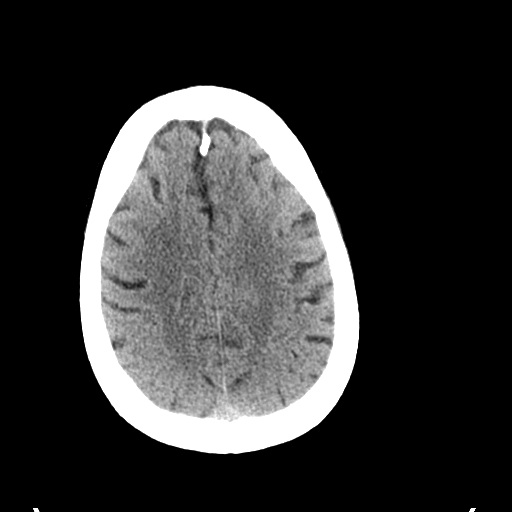
[im 26/33  brain]
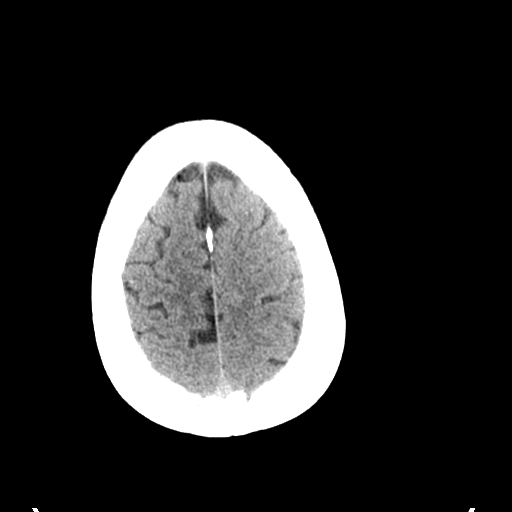
[im 30/33  brain]
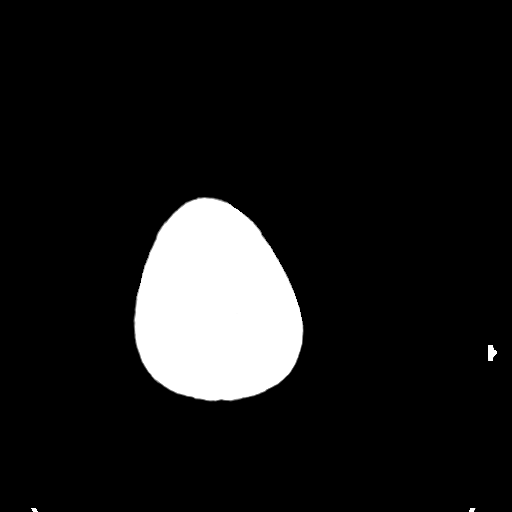

[Series 4: coronal soft tissue · coronal · 0.32mm/px · 3 of 82 slices shown]
[im 28/82  brain]
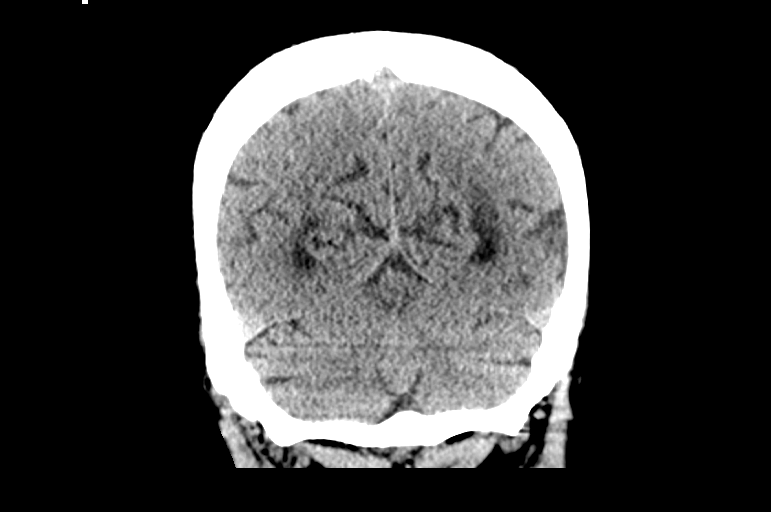
[im 37/82  brain]
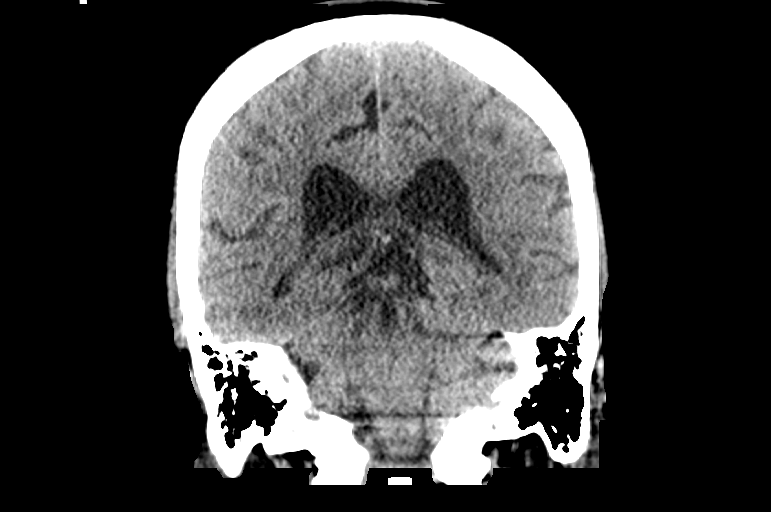
[im 46/82  brain]
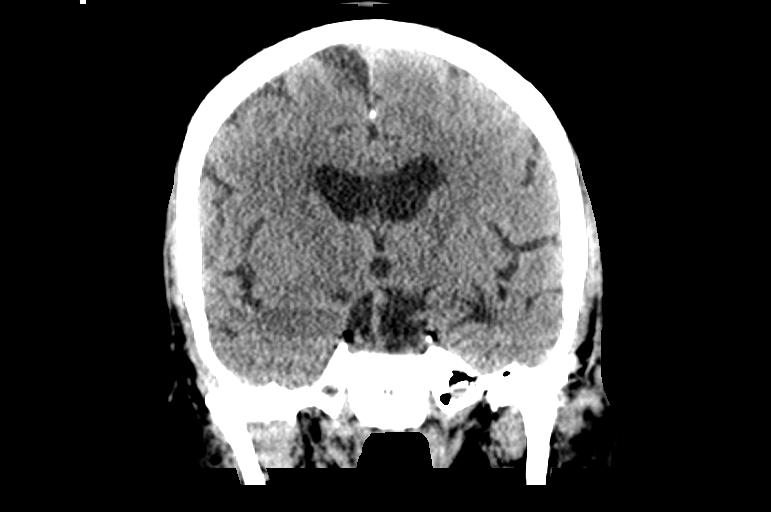

[Series 5: sagittal soft tissue · sagittal · 0.32mm/px · 3 of 74 slices shown]
[im 25/74  brain]
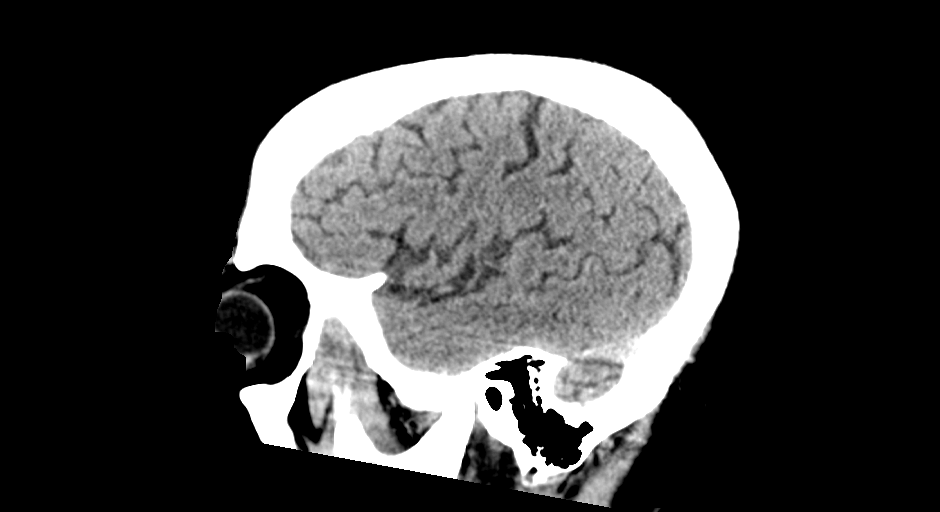
[im 37/74  brain]
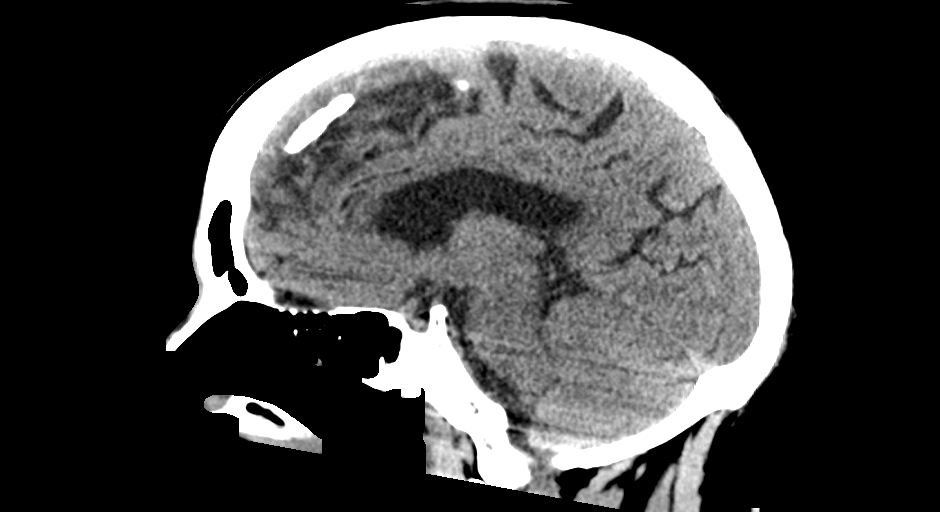
[im 49/74  brain]
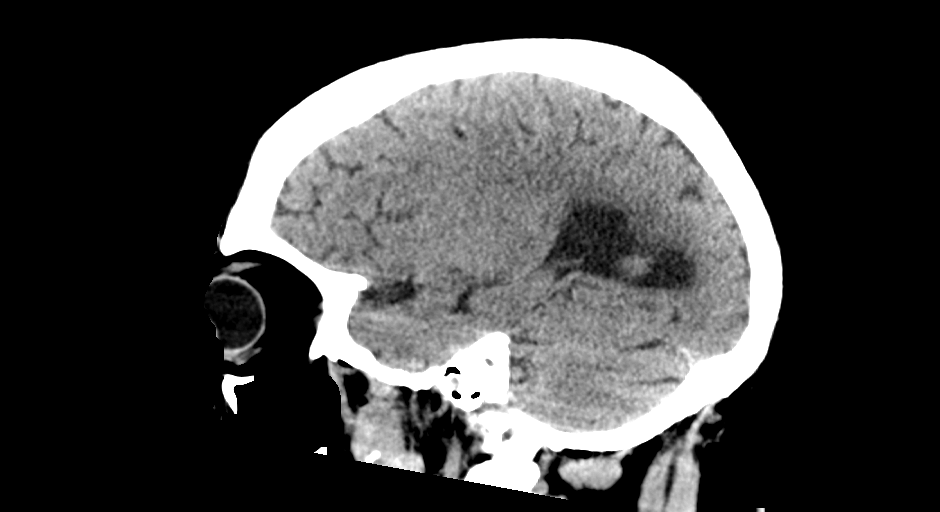

[14 of 47 positions shown; findings below may reference images not displayed]

FINDINGS: Brain: The brainstem, cerebellum, cerebral peduncles, thalami, basal
ganglia, basilar cisterns, and ventricular system appear within
normal limits. No intracranial hemorrhage, mass lesion, or acute
CVA.

Vascular: Unremarkable

Skull: Unremarkable

Sinuses/Orbits: Unremarkable

Other: No supplemental non-categorized findings.
IMPRESSION: 1. No significant intracranial abnormality is identified on CT scan
to explain the patient's recent headaches.

## 2019-03-12 ENCOUNTER — Telehealth: Payer: Self-pay

## 2019-03-12 NOTE — Telephone Encounter (Signed)
REFERRAL SENT TO SCHEDULING NO NOTES

## 2019-03-16 ENCOUNTER — Ambulatory Visit (HOSPITAL_COMMUNITY)
Admission: EM | Admit: 2019-03-16 | Discharge: 2019-03-16 | Disposition: A | Discharge status: Home / Self Care | Payer: Commercial Managed Care - PPO | Attending: Family Medicine | Admitting: Family Medicine

## 2019-03-16 ENCOUNTER — Encounter (HOSPITAL_COMMUNITY): Payer: Self-pay | Admitting: *Deleted

## 2019-03-16 ENCOUNTER — Other Ambulatory Visit: Payer: Self-pay

## 2019-03-16 DIAGNOSIS — I10 Essential (primary) hypertension: Secondary | ICD-10-CM | POA: Diagnosis not present

## 2019-03-16 HISTORY — DX: Essential (primary) hypertension: I10

## 2019-03-16 NOTE — Discharge Instructions (Addendum)
Follow-up with primary care physician related to cardiology referral Increase amlodipine from 5 mg to 10 mg taking twice a day

## 2019-03-16 NOTE — ED Triage Notes (Addendum)
States starting HTN med approx 2 wks ago after experiencing some occasional "sometimes my chest doesn't feel right", fatigue - went to PCP.  States EKG was done and normal.  Has been taking HTN meds regularly as prescribed, but has no way of checking his BP at home.  C/O continued fatigue.

## 2019-03-16 NOTE — ED Provider Notes (Signed)
[facility]    CSN: [id number] Arrival date & time: 03/16/19  1034      History   Chief Complaint Chief Complaint  Patient presents with  . Appointment    1030  . Hypertension    HPI Devin Novak is a 63 y.o. male.   Patient presents today with some palpitations.  He has a long history of diabetes x25 years.  He also has hypertension and hyperlipidemia.  Had recent EKG and was started on a second blood pressure pill.  He is on ACE inhibitor.  There is also been complaining of increased tiredness.  Most recent A1c was 6.3 per his history.  Do not have access to his records since he is seen by [facility] physicians  HPI  Past Medical History:  Diagnosis Date  . Bell's palsy 11/16/2017   left  . Diabetes mellitus without complication ([place])   . ED (erectile dysfunction) 08/19/2016  . Hyperlipidemia   . Hypertension     Patient Active Problem List   Diagnosis Date Noted  . Erectile dysfunction 08/04/2017  . Dystrophic nail 08/04/2017  . Diabetes mellitus type 2, uncomplicated ([place]) 56/81/2751  . Dyslipidemia 06/04/2015    Past Surgical History:  Procedure Laterality Date  . COLONOSCOPY  Not sure       Home Medications    Prior to Admission medications   Medication Sig Start Date End Date Taking? Authorizing Provider  atorvastatin (LIPITOR) 40 MG tablet Take 1 tablet (40 mg total) by mouth daily. 08/04/17  Yes Jeffery, Chelle, PA  glimepiride (AMARYL) 2 MG tablet TAKE ONE TABLET BY MOUTH ONCE DAILY BEFORE  BREAKFAST 08/04/17  Yes Jeffery, Chelle, PA  hydrOXYzine (ATARAX/VISTARIL) 25 MG tablet Take 1 tablet (25 mg total) by mouth every 8 (eight) hours as needed for itching. 11/02/17  Yes Jacqulynn Cadet, Chelle, PA  metFORMIN (GLUCOPHAGE) 1000 MG tablet Take 1 tablet (1,000 mg total) by mouth 2 (two) times daily with a meal. 11/02/17  Yes Jeffery, Chelle, PA  ramipril (ALTACE) 10 MG capsule Take 1 capsule (10 mg total) by mouth daily. 08/04/17  Yes Harrison Mons, PA   aspirin-acetaminophen-caffeine (EXCEDRIN MIGRAINE) [phone] MG tablet Take 1 tablet by mouth every 6 (six) hours as needed for headache. 11/05/17   Drenda Freeze, MD  azithromycin (ZITHROMAX) 250 MG tablet Take 2 pills by mouth on day 1, then 1 pill by mouth per day on days 2 through 5. 07/13/18   Wendie Agreste, MD  blood glucose meter kit and supplies KIT Dispense based on patient and insurance preference. Use up to four times daily as directed. (FOR ICD-9 250.00, 250.01). 02/04/16   McVey, Gelene Mink, PA-C    Family History Family History  Problem Relation Age of Onset  . Cancer Mother 15       colon cancer  . Diabetes Mother     Social History Social History   Tobacco Use  . Smoking status: Never Smoker  . Smokeless tobacco: Current User    Types: Chew  Substance Use Topics  . Alcohol use: Yes    Alcohol/week: 7.0 standard drinks    Types: 7 Cans of beer per week  . Drug use: Never     Allergies   Patient has no known allergies.   Review of Systems Review of Systems  Constitutional: Negative.   HENT: Negative.   Respiratory: Negative.   Cardiovascular: Positive for palpitations.  Genitourinary: Negative.   Musculoskeletal: Negative.   Neurological: Negative.  Physical Exam Triage Vital Signs ED Triage Vitals  Enc Vitals Group     BP 03/16/19 1056 (!) 160/92     Pulse Rate 03/16/19 1056 85     Resp 03/16/19 1056 14     Temp 03/16/19 1056 97.6 F (36.4 C)     Temp Source 03/16/19 1056 Other     SpO2 03/16/19 1056 99 %     Weight --      Height --      Head Circumference --      Peak Flow --      Pain Score 03/16/19 1057 0     Pain Loc --      Pain Edu? --      Excl. in [place]? --    No data found.  Updated Vital Signs BP (!) 160/92 Comment: took HTN meds approx 2 hrs ago  Pulse 85   Temp 97.6 F (36.4 C) (Other (Comment))   Resp 14   SpO2 99%   Visual Acuity Right Eye Distance:   Left Eye Distance:   Bilateral Distance:     Right Eye Near:   Left Eye Near:    Bilateral Near:     Physical Exam Constitutional:      Appearance: Normal appearance. He is normal weight.  HENT:     Head: Normocephalic.  Cardiovascular:     Rate and Rhythm: Regular rhythm.     Pulses: Normal pulses.     Heart sounds: Normal heart sounds.  Pulmonary:     Effort: Pulmonary effort is normal.     Breath sounds: Normal breath sounds.  Neurological:     Mental Status: He is alert.      UC Treatments / Results  Labs (all labs ordered are listed, but only abnormal results are displayed) Labs Reviewed - No data to display  EKG   Radiology No results found.  Procedures Procedures (including critical care time)  Medications Ordered in UC Medications - No data to display  Initial Impression / Assessment and Plan / UC Course  I have reviewed the triage vital signs and the nursing notes.  Pertinent labs & imaging results that were available during my care of the patient were reviewed by me and considered in my medical decision making (see chart for details).     Tiredness with some palpitations..  There is no chest pain but with multiple risk factors for cardiac disease I think evaluation by cardiology would be appropriate with possible stress test and echo.  I have asked him to get back in touch with his primary care physician.  In the meantime will increase amlodipine from 5 mg to 10 mg to get better control of his blood pressure. Final Clinical Impressions(s) / UC Diagnoses   Final diagnoses:  None   Discharge Instructions   None    ED Prescriptions    None     PDMP not reviewed this encounter.   Wardell Honour, MD 03/16/19 1145

## 2019-03-21 ENCOUNTER — Telehealth: Payer: Self-pay

## 2019-03-21 NOTE — Telephone Encounter (Signed)
NOTES ON FILE FROM [place] [facility] [phone]

## 2019-04-08 NOTE — Progress Notes (Signed)
Cardiology Office Note:   Date:  04/11/2019  NAME:  Devin Novak    MRN: [id number] DOB:  03-Jan-1956   PCP:  Shirline Frees, MD  Cardiologist:  Evalina Field, MD   Referring MD: Shirline Frees, MD   Chief Complaint  Patient presents with  . Palpitations    History of Present Illness:   Devin Novak is a 63 y.o. male with a hx of diabetes, hypertension, hyperlipidemia who is being seen today for the evaluation of palpitations at the request of Shirline Frees, MD.  He reports around 3 weeks ago he had 1-2 daily episodes of palpitations and intermittent chest pain.  He reports he gets a sensation of rapid heartbeat the last 3 to 5 minutes that was associated with no activity that he could identify and would resolve without any intervention.  He does report occasional chest tightness as well as fatigue.  This is gone on for a few months as well.  He describes no anxiety.  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  Cardiovascular disease risk factors include hypertension, hyperlipidemia, diabetes.  He reports his most recent A1c was 6.1.  He reports no prior history of stroke or myocardial infarction.  He also reports that he uses chew tobacco and has done so for years.  He does not smoke cigarettes.  He reports daily alcohol use up to 2 beers per day.  There is no strong family history of coronary disease.  Interestingly, he reports that he is no longer having palpitations.  He does still get occasional chest tightness and he is quite fatigued.  Past Medical History: Past Medical History:  Diagnosis Date  . Bell's palsy 11/16/2017   left  . Diabetes mellitus without complication ([place])   . ED (erectile dysfunction) 08/19/2016  . Hyperlipidemia   . Hypertension     Past Surgical History: Past Surgical History:  Procedure Laterality Date  . COLONOSCOPY  Not sure     Current Medications: Current Meds  Medication Sig  . amLODipine (NORVASC) 5 MG tablet Take 5 mg by mouth daily.  Marland Kitchen amlodipine-atorvastatin (CADUET) 10-10 MG tablet Take 1 tablet by mouth daily.  Marland Kitchen aspirin-acetaminophen-caffeine (EXCEDRIN MIGRAINE) 250-250-65 MG tablet Take 1 tablet by mouth every 6 (six) hours as needed for headache.  Marland Kitchen atorvastatin (LIPITOR) 40 MG tablet Take 1 tablet (40 mg total) by mouth daily.  Marland Kitchen azithromycin (ZITHROMAX) 250 MG tablet Take 2 pills by mouth on day 1, then 1 pill by mouth per day on days 2 through 5.  . blood glucose meter kit and supplies KIT Dispense based on patient and insurance preference. Use up to four times daily as directed. (FOR ICD-9 250.00, 250.01).  Marland Kitchen glimepiride (AMARYL) 2 MG tablet TAKE ONE TABLET BY MOUTH ONCE DAILY BEFORE  BREAKFAST  . glipiZIDE (GLUCOTROL) 10 MG tablet TAKE 1 TABLET BY MOUTH ONCE DAILY FOR 90 DAYS  . hydrOXYzine (ATARAX/VISTARIL) 25 MG tablet Take 1 tablet (25 mg total) by mouth every 8 (eight) hours as needed for itching.  . hyoscyamine (LEVSIN SL) 0.125 MG SL tablet DISSOLVE 1 TABLET UNDER THE TONGUE THREE TIMES DAILY AS NEEDED FOR ABDOMINAL CRAMPING  . metFORMIN (GLUCOPHAGE) 1000 MG tablet Take 1 tablet (1,000 mg total) by mouth 2 (two) times daily with a meal.  . pantoprazole (PROTONIX) 40 MG tablet TAKE 1 TABLET BY MOUTH ONCE DAILY FOR  30 DAYS  . ramipril (ALTACE) 10 MG capsule Take 1 capsule (10 mg total) by mouth daily.     Allergies:    Patient has no known allergies.   Social History: Social History   Socioeconomic History  . Marital status: Married    Spouse name: Lorriane Shire  . Number of children: 1  . Years of education: Not on file  . Highest education level: Not on file  Occupational History  . Occupation: Engineer, building services  Social Needs  . Financial resource strain: Not on file  . Food insecurity    Worry: Not on file    Inability: Not on file  . Transportation needs    Medical:  Not on file    Non-medical: Not on file  Tobacco Use  . Smoking status: Never Smoker  . Smokeless tobacco: Current User    Types: Chew  Substance and Sexual Activity  . Alcohol use: Yes    Alcohol/week: 7.0 standard drinks    Types: 7 Cans of beer per week  . Drug use: Never  . Sexual activity: Yes  Lifestyle  . Physical activity    Days per week: Not on file    Minutes per session: Not on file  . Stress: Not on file  Relationships  . Social Herbalist on phone: Not on file    Gets together: Not on file    Attends religious service: Not on file    Active member of club or organization: Not on file    Attends meetings of clubs or organizations: Not on file    Relationship status: Not on file  Other Topics Concern  . Not on file  Social History Narrative   Marital status: married to wife Lorriane Shire since 2013     Children:  1 son; 4 grandchildren in area      Lives: with wife      Employment:  Environmental manager; worked for [employer] for 30 years      Tobacco:  None      Alcohol: beer 2 per day      Exercise:  none     Family History: The patient's family history includes Cancer (age of onset: 70) in his mother; Diabetes in his mother.  ROS:   All other ROS reviewed and negative. Pertinent positives noted in the HPI.     EKGs/Labs/Other Studies Reviewed:   The following studies were personally reviewed by me today:  EKG:  EKG is ordered today.  The ekg ordered today demonstrates normal sinus rhythm, heart rate 77, normal intervals, no acute ST-T changes, no evidence of prior infarction, normal EKG, and was personally reviewed by me.   Recent Labs: No results found for requested labs within last 8760 hours.   Recent Lipid Panel    Component Value Date/Time   CHOL 131 08/04/2017 0956   TRIG 83 08/04/2017 0956   HDL 48 08/04/2017 0956   CHOLHDL 2.7 08/04/2017 0956   CHOLHDL 2.4 02/04/2016 0918   VLDL 10 02/04/2016 0918   LDLCALC 66 08/04/2017  0956    Physical Exam:   VS:  BP 135/83   Pulse 79   Temp (!) 97.2 F (36.2 C)   Ht 5' 11"  (1.803 m)   Wt 207 lb 3.2 oz (94 kg)   SpO2 96%   BMI 28.90 kg/m    Wt Readings from Last 3 Encounters:  04/11/19 207 lb 3.2 oz (94 kg)  07/13/18 222 lb (  100.7 kg)  03/22/18 213 lb 12.8 oz (97 kg)    General: Well nourished, well developed, in no acute distress Heart: Atraumatic, normal size  Eyes: PEERLA, EOMI  Neck: Supple, no JVD Endocrine: No thryomegaly Cardiac: Normal S1, S2; RRR; no murmurs, rubs, or gallops Lungs: Clear to auscultation bilaterally, no wheezing, rhonchi or rales  Abd: Soft, nontender, no hepatomegaly  Ext: No edema, pulses 2+ Musculoskeletal: No deformities, BUE and BLE strength normal and equal Skin: Warm and dry, no rashes   Neuro: Alert and oriented to person, place, time, and situation, CNII-XII grossly intact, no focal deficits  Psych: Normal mood and affect   ASSESSMENT:   Shane Badeaux is a 63 y.o. male who presents for the following: 1. Palpitations   2. Chest pain of uncertain etiology   3. Essential hypertension   4. Mixed hyperlipidemia     PLAN:   1. Palpitations 2. Chest pain of uncertain etiology -He presents with palpitations and atypical chest pain.  The pain can occur at any time described as kind of tightness and dull pain.  He is no longer having palpitations.  His EKG is very normal. -We will proceed with an echocardiogram as well as Lexiscan nuclear medicine stress test -We will see him back in 1 month after the above testing  3. Essential hypertension -Blood pressure controlled on current medications -He is taking Caduet and amlodipine and Lipitor individually.  I have instructed him to just take his Caduet-atorvastatin-amlodipine combination pill which includes 10 mg of amlodipine and 10 mg of Lipitor  4. Mixed hyperlipidemia -Continue Lipitor  Disposition: Return in about 1 month (around 05/12/2019).  Medication  Adjustments/Labs and Tests Ordered: Current medicines are reviewed at length with the patient today.  Concerns regarding medicines are outlined above.  Orders Placed This Encounter  Procedures  . MYOCARDIAL PERFUSION IMAGING  . EKG 12-Lead  . ECHOCARDIOGRAM COMPLETE   No orders of the defined types were placed in this encounter.   Patient Instructions  Medication Instructions:  NO CHANGES  *If you need a refill on your cardiac medications before your next appointment, please call your pharmacy*  Lab Work: NOT  NEEDED   Testing/Procedures: WILL BE SCHEDULE AT [place] [address] Your physician has requested that you have a lexiscan myoview. For further information please visit [email or web address]. Please follow instruction sheet, as given.  AND WILL BE AT [place] [address] Your physician has requested that you have an echocardiogram. Echocardiography is a painless test that uses sound waves to create images of your heart. It provides your doctor with information about the size and shape of your heart and how well your heart's chambers and valves are working. This procedure takes approximately one hour. There are no restrictions for this procedure.    Follow-Up: At [facility], you and your health needs are our priority.  As part of our continuing mission to provide you with exceptional heart care, we have created designated Provider Care Teams.  These Care Teams include your primary Cardiologist (physician) and Advanced Practice Providers (APPs -  Physician Assistants and Nurse Practitioners) who all work together to provide you with the care you need, when you need it.  Your next appointment:   1 month  The format for your next appointment:   In Person  Provider:   Eleonore Chiquito, MD  Other Instructions     Signed, Addison Naegeli. Audie Box, [place]  [address], [address]  [place], [place] [address] [phone]   04/11/2019 10:07 AM

## 2019-04-11 ENCOUNTER — Encounter: Payer: Self-pay | Admitting: Cardiovascular Disease

## 2019-04-11 ENCOUNTER — Other Ambulatory Visit: Payer: Self-pay

## 2019-04-11 ENCOUNTER — Ambulatory Visit: Payer: Commercial Managed Care - PPO | Admitting: Cardiovascular Disease

## 2019-04-11 VITALS — BP 135/83 | HR 79 | Temp 97.2°F | Ht 71.0 in | Wt 207.2 lb

## 2019-04-11 DIAGNOSIS — R079 Chest pain, unspecified: Secondary | ICD-10-CM

## 2019-04-11 DIAGNOSIS — E782 Mixed hyperlipidemia: Secondary | ICD-10-CM

## 2019-04-11 DIAGNOSIS — I10 Essential (primary) hypertension: Secondary | ICD-10-CM | POA: Diagnosis not present

## 2019-04-11 DIAGNOSIS — R002 Palpitations: Secondary | ICD-10-CM

## 2019-04-11 NOTE — Patient Instructions (Signed)
Medication Instructions:  NO CHANGES  *If you need a refill on your cardiac medications before your next appointment, please call your pharmacy*  Lab Work: NOT  NEEDED   Testing/Procedures: WILL BE SCHEDULE AT [place] [address] Your physician has requested that you have a lexiscan myoview. For further information please visit [email or web address]. Please follow instruction sheet, as given.  AND WILL BE AT [place] [address] Your physician has requested that you have an echocardiogram. Echocardiography is a painless test that uses sound waves to create images of your heart. It provides your doctor with information about the size and shape of your heart and how well your heart's chambers and valves are working. This procedure takes approximately one hour. There are no restrictions for this procedure.    Follow-Up: At [facility], you and your health needs are our priority.  As part of our continuing mission to provide you with exceptional heart care, we have created designated Provider Care Teams.  These Care Teams include your primary Cardiologist (physician) and Advanced Practice Providers (APPs -  Physician Assistants and Nurse Practitioners) who all work together to provide you with the care you need, when you need it.  Your next appointment:   1 month  The format for your next appointment:   In Person  Provider:   Eleonore Chiquito, MD  Other Instructions

## 2019-04-16 ENCOUNTER — Telehealth (HOSPITAL_COMMUNITY): Payer: Self-pay

## 2019-04-16 NOTE — Telephone Encounter (Signed)
Encounter complete. 

## 2019-04-18 ENCOUNTER — Ambulatory Visit (HOSPITAL_COMMUNITY)
Admission: RE | Admit: 2019-04-18 | Discharge: 2019-04-18 | Disposition: A | Discharge status: Home / Self Care | Payer: Commercial Managed Care - PPO | Source: Ambulatory Visit | Attending: Cardiology | Admitting: Cardiology

## 2019-04-18 ENCOUNTER — Other Ambulatory Visit: Payer: Self-pay

## 2019-04-18 ENCOUNTER — Encounter: Payer: Self-pay | Admitting: Cardiovascular Disease

## 2019-04-18 DIAGNOSIS — R002 Palpitations: Secondary | ICD-10-CM

## 2019-04-18 DIAGNOSIS — R079 Chest pain, unspecified: Secondary | ICD-10-CM

## 2019-04-19 ENCOUNTER — Ambulatory Visit (HOSPITAL_COMMUNITY): Payer: Commercial Managed Care - PPO | Attending: Cardiology

## 2019-04-19 DIAGNOSIS — R002 Palpitations: Secondary | ICD-10-CM | POA: Diagnosis not present

## 2019-04-19 DIAGNOSIS — R079 Chest pain, unspecified: Secondary | ICD-10-CM

## 2019-04-23 ENCOUNTER — Telehealth (HOSPITAL_COMMUNITY): Payer: Self-pay

## 2019-04-23 NOTE — Telephone Encounter (Signed)
Encounter complete. 

## 2019-04-25 ENCOUNTER — Encounter (HOSPITAL_COMMUNITY): Payer: Self-pay

## 2019-04-25 ENCOUNTER — Inpatient Hospital Stay (HOSPITAL_COMMUNITY): Admission: RE | Admit: 2019-04-25 | Payer: Commercial Managed Care - PPO | Source: Ambulatory Visit

## 2019-05-20 NOTE — Progress Notes (Deleted)
Cardiology Office Note:   Date:  05/20/2019  NAME:  Devin Novak    MRN: [id number] DOB:  08/08/1955   PCP:  Johny Blamer, MD  Cardiologist:  Reatha Harps, MD  Electrophysiologist:  None   Referring MD: Johny Blamer, MD   No chief complaint on file. ***  History of Present Illness:   Devin Novak is a 63 y.o. male with a hx of diabetes, hypertension, hyperlipidemia who presents for follow-up of chest pain/palpitations.  Echocardiogram unremarkable.  Nuclear medicine stress test not performed.    Past Medical History: Past Medical History:  Diagnosis Date  . Bell's palsy 11/16/2017   left  . Diabetes mellitus without complication ([facility])   . ED (erectile dysfunction) 08/19/2016  . Hyperlipidemia   . Hypertension     Past Surgical History: Past Surgical History:  Procedure Laterality Date  . COLONOSCOPY  Not sure    Current Medications: No outpatient medications have been marked as taking for the 05/24/19 encounter (Appointment) with O'Neal, Ronnald Ramp, MD.     Allergies:    Patient has no known allergies.   Social History: Social History   Socioeconomic History  . Marital status: Married    Spouse name: Devin Novak  . Number of children: 1  . Years of education: Not on file  . Highest education level: Not on file  Occupational History  . Occupation: Surveyor, minerals  Social Needs  . Financial resource strain: Not on file  . Food insecurity    Worry: Not on file    Inability: Not on file  . Transportation needs    Medical: Not on file    Non-medical: Not on file  Tobacco Use  . Smoking status: Never Smoker  . Smokeless tobacco: Current User    Types: Chew  Substance and Sexual Activity  . Alcohol use: Yes    Alcohol/week: 7.0 standard drinks    Types: 7 Cans of beer per week  . Drug use: Never  . Sexual activity: Yes  Lifestyle  . Physical activity    Days per week: Not on file    Minutes per session: Not on  file  . Stress: Not on file  Relationships  . Social Musician on phone: Not on file    Gets together: Not on file    Attends religious service: Not on file    Active member of club or organization: Not on file    Attends meetings of clubs or organizations: Not on file    Relationship status: Not on file  Other Topics Concern  . Not on file  Social History Narrative   Marital status: married to wife Devin Novak since 2013     Children:  1 son; 4 grandchildren in area      Lives: with wife      Employment:  Banker; worked for [employer] for 30 years      Tobacco:  None      Alcohol: beer 2 per day      Exercise:  none     Family History: The patient's ***family history includes Cancer (age of onset: 35) in his mother; Diabetes in his mother.  ROS:   All other ROS reviewed and negative. Pertinent positives noted in the HPI.     EKGs/Labs/Other Studies Reviewed:   The following studies were personally reviewed by me today:  EKG:  EKG is *** ordered today.  The ekg ordered today demonstrates ***, and was  personally reviewed by me.   TTE 04/19/2019  1. Left ventricular ejection fraction, by visual estimation, is 65 to 70%. The left ventricle has hyperdynamic function. There is mildly increased left ventricular hypertrophy.  2. Left ventricular diastolic parameters are consistent with Grade I diastolic dysfunction (impaired relaxation).  3. Global right ventricle has normal systolic function.The right ventricular size is normal. No increase in right ventricular wall thickness.  4. Left atrial size was normal.  5. Right atrial size was normal.  6. The mitral valve is normal in structure. Trace mitral valve regurgitation. No evidence of mitral stenosis.  7. The tricuspid valve is normal in structure. Tricuspid valve regurgitation is mild.  8. The aortic valve is normal in structure. Aortic valve regurgitation is not visualized. No evidence of aortic valve  sclerosis or stenosis.  9. The pulmonic valve was normal in structure. Pulmonic valve regurgitation is not visualized. 10. Mildly elevated pulmonary artery systolic pressure. 11. The tricuspid regurgitant velocity is 2.65 m/s, and with an assumed right atrial pressure of 10 mmHg, the estimated right ventricular systolic pressure is mildly elevated at 38.1 mmHg. 12. The inferior vena cava is normal in size with greater than 50% respiratory variability, suggesting right atrial pressure of 3 mmHg.  Recent Labs: No results found for requested labs within last 8760 hours.   Recent Lipid Panel    Component Value Date/Time   CHOL 131 08/04/2017 0956   TRIG 83 08/04/2017 0956   HDL 48 08/04/2017 0956   CHOLHDL 2.7 08/04/2017 0956   CHOLHDL 2.4 02/04/2016 0918   VLDL 10 02/04/2016 0918   LDLCALC 66 08/04/2017 0956    Physical Exam:   VS:  There were no vitals taken for this visit.   Wt Readings from Last 3 Encounters:  04/11/19 207 lb 3.2 oz (94 kg)  07/13/18 222 lb (100.7 kg)  03/22/18 213 lb 12.8 oz (97 kg)    General: Well nourished, well developed, in no acute distress Heart: Atraumatic, normal size  Eyes: PEERLA, EOMI  Neck: Supple, no JVD Endocrine: No thryomegaly Cardiac: Normal S1, S2; RRR; no murmurs, rubs, or gallops Lungs: Clear to auscultation bilaterally, no wheezing, rhonchi or rales  Abd: Soft, nontender, no hepatomegaly  Ext: No edema, pulses 2+ Musculoskeletal: No deformities, BUE and BLE strength normal and equal Skin: Warm and dry, no rashes   Neuro: Alert and oriented to person, place, time, and situation, CNII-XII grossly intact, no focal deficits  Psych: Normal mood and affect   ASSESSMENT:   Devin Novak is a 63 y.o. male who presents for the following: No diagnosis found.  PLAN:   There are no diagnoses linked to this encounter.  Disposition: No follow-ups on file.  Medication Adjustments/Labs and Tests Ordered: Current medicines are reviewed  at length with the patient today.  Concerns regarding medicines are outlined above.  No orders of the defined types were placed in this encounter.  No orders of the defined types were placed in this encounter.   There are no Patient Instructions on file for this visit.   Signed, Addison Naegeli. Audie Box, [place]  [address] [place], [place] [address] [phone]  05/20/2019 1:48 PM

## 2019-05-24 ENCOUNTER — Ambulatory Visit: Payer: Commercial Managed Care - PPO | Admitting: Cardiovascular Disease

## 2020-03-27 ENCOUNTER — Other Ambulatory Visit: Payer: Self-pay | Admitting: Physician Assistant

## 2020-03-27 DIAGNOSIS — R109 Unspecified abdominal pain: Secondary | ICD-10-CM

## 2020-04-13 ENCOUNTER — Other Ambulatory Visit: Payer: Commercial Managed Care - PPO

## 2020-04-16 ENCOUNTER — Ambulatory Visit
Admission: RE | Admit: 2020-04-16 | Discharge: 2020-04-16 | Disposition: A | Discharge status: Home / Self Care | Payer: Commercial Managed Care - PPO | Source: Ambulatory Visit | Attending: Physician Assistant | Admitting: Physician Assistant

## 2020-04-16 DIAGNOSIS — R109 Unspecified abdominal pain: Secondary | ICD-10-CM

## 2020-04-16 MED ORDER — IOPAMIDOL (ISOVUE-300) INJECTION 61%
100.0000 mL | Freq: Once | INTRAVENOUS | Status: AC | PRN
  Administered 2020-04-16: 100 mL via INTRAVENOUS

## 2021-08-09 IMAGING — CT CT ABD-PELV W/ CM
2 of 5 series · 16 of 46 positions shown, 18 images · IV contrast (iopamidol)
Comparison: 06/14/2006

CLINICAL DATA: Left lower quadrant pain, worse with movement for 3
months

EXAM:
CT ABDOMEN AND PELVIS WITH CONTRAST
TECHNIQUE: Multidetector CT imaging of the abdomen and pelvis was performed
using the standard protocol following bolus administration of
intravenous contrast.
CONTRAST:  100mL 5RZG1G-2OO IOPAMIDOL (5RZG1G-2OO) INJECTION 61%,
additional oral enteric contrast

[Series 2: abd pelvis 5.00 br40 s3 axial · axial · 0.82mm/px · z∈[+1400,+1855]mm · 13 of 103 slices shown, 15 images]
[im 6/103  soft-tissue]
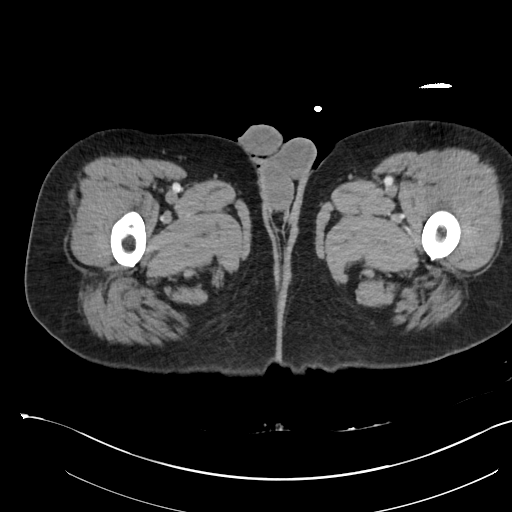
[im 6/103  bone]
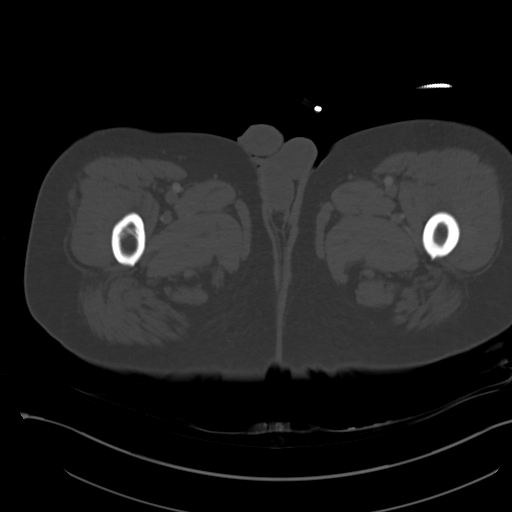
[im 17/103  soft-tissue]
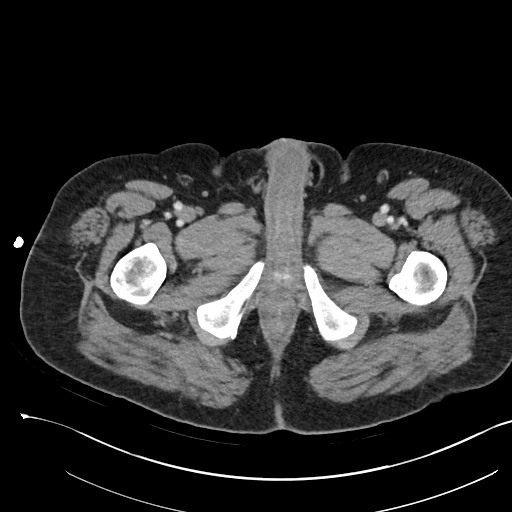
[im 22/103  soft-tissue]
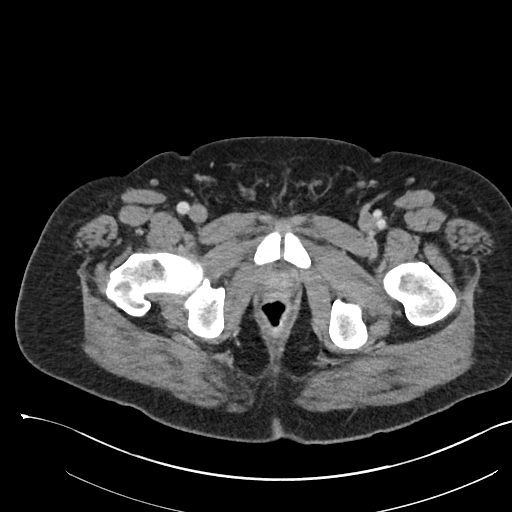
[im 27/103  soft-tissue]
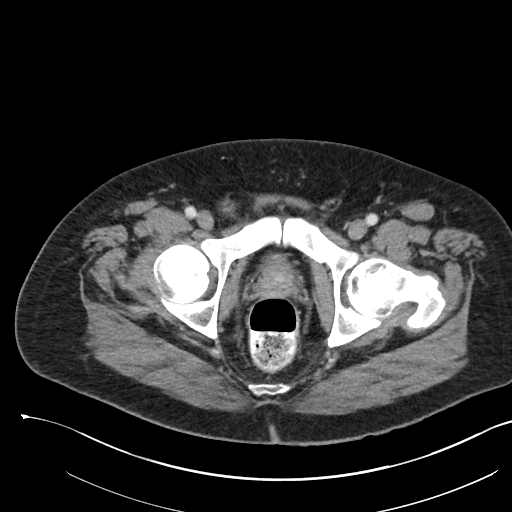
[im 38/103  soft-tissue]
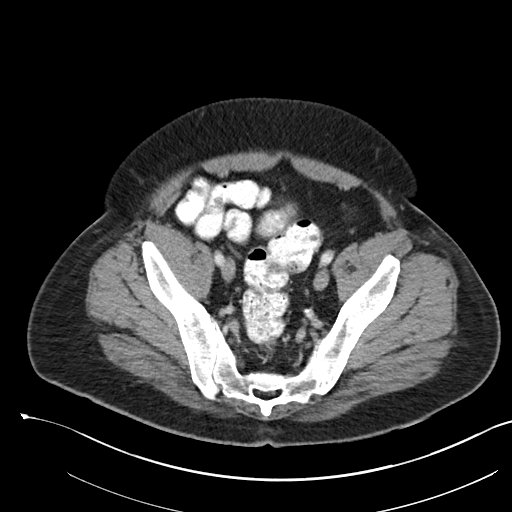
[im 43/103  soft-tissue]
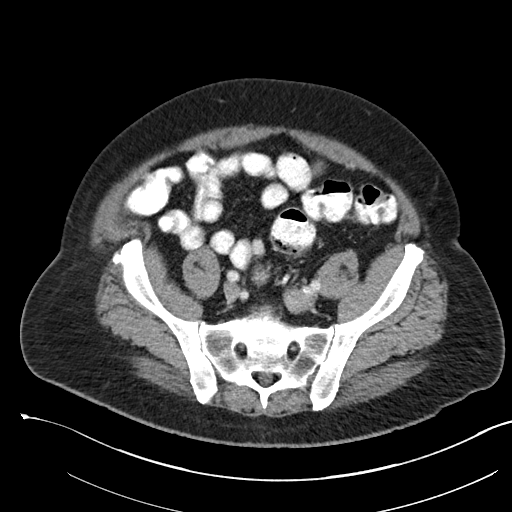
[im 54/103  soft-tissue]
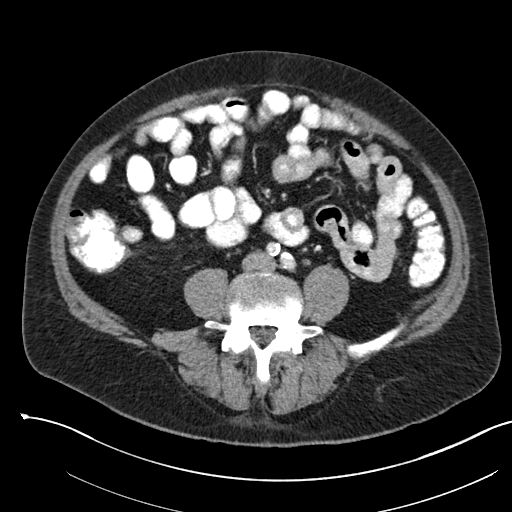
[im 60/103  soft-tissue]
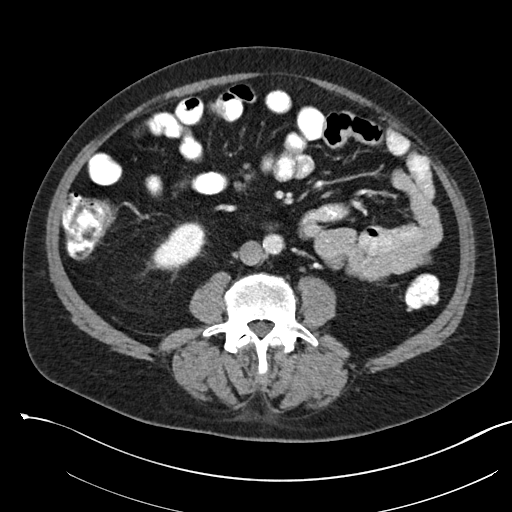
[im 65/103  soft-tissue]
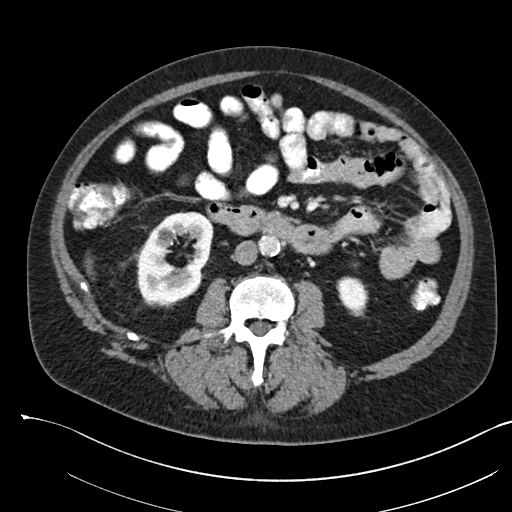
[im 65/103  bone]
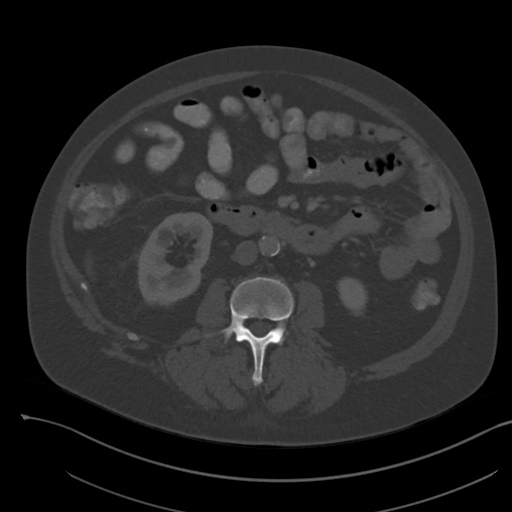
[im 76/103  soft-tissue]
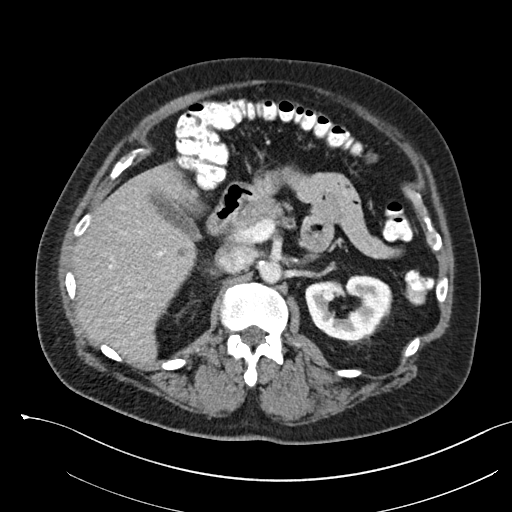
[im 81/103  soft-tissue]
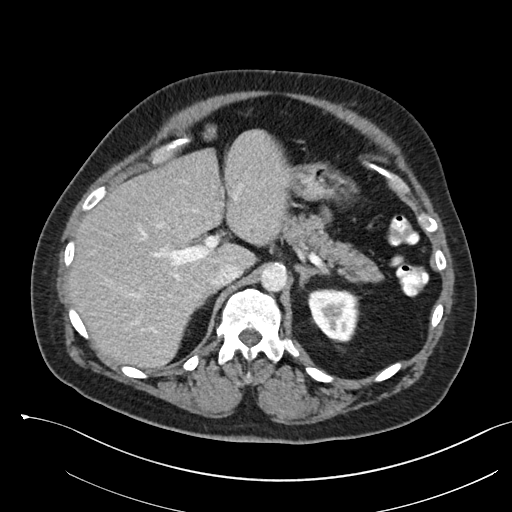
[im 86/103  soft-tissue]
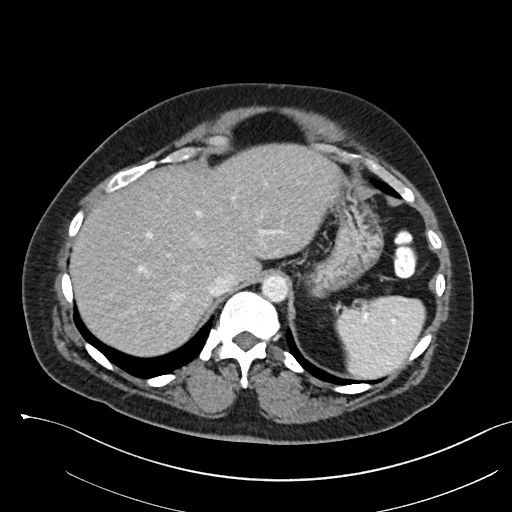
[im 97/103  soft-tissue]
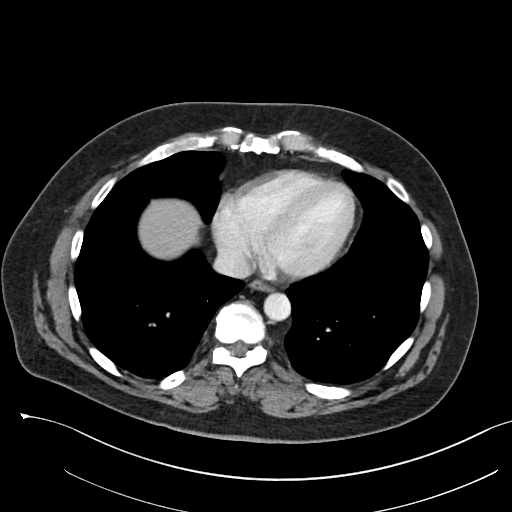

[Series 6: abd pelvis 2.00 br40 s3 cor · coronal · 0.82mm/px · 3 of 209 slices shown]
[im 70/209  soft-tissue]
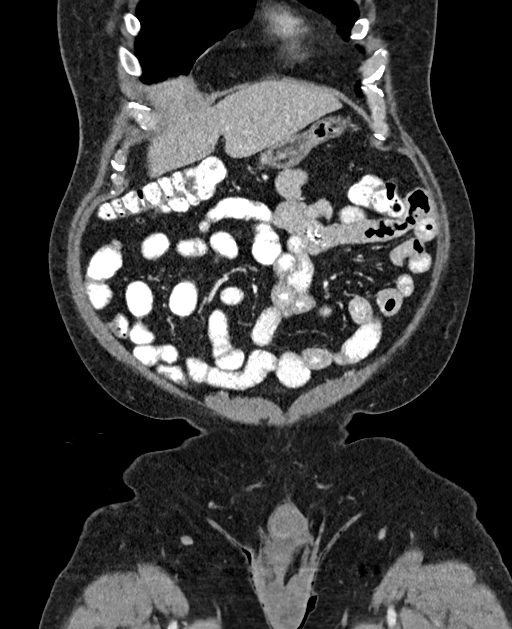
[im 93/209  soft-tissue]
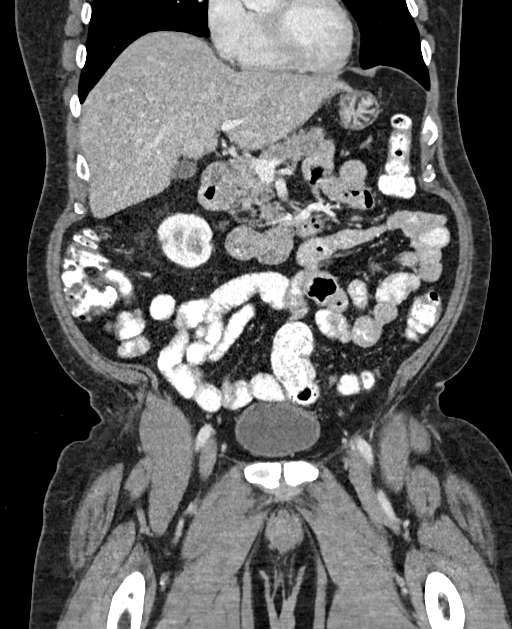
[im 116/209  soft-tissue]
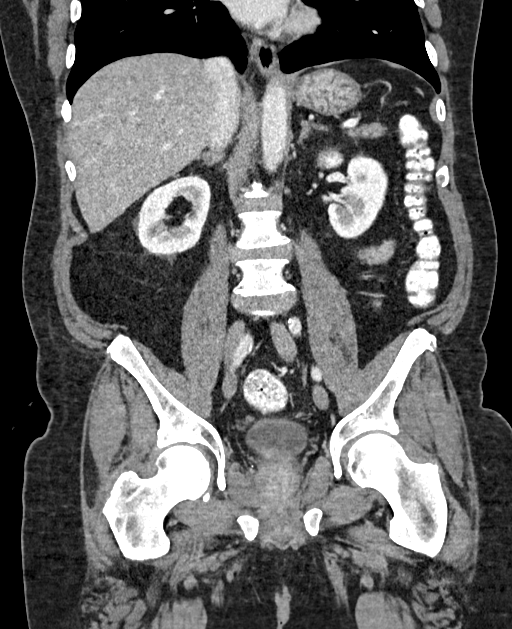

[16 of 46 positions shown; findings below may reference images not displayed]

FINDINGS: Lower chest: No acute abnormality.

Hepatobiliary: No solid liver abnormality is seen. Hepatic
steatosis. No gallstones, gallbladder wall thickening, or biliary
dilatation.

Pancreas: Unremarkable. No pancreatic ductal dilatation or
surrounding inflammatory changes.

Spleen: Normal in size without significant abnormality.

Adrenals/Urinary Tract: Adrenal glands are unremarkable. Small
nonobstructive calculus of the inferior pole of the right kidney
(series 6, image 100). No hydronephrosis. The kidneys are otherwise
normal. Bladder is unremarkable.

Stomach/Bowel: Stomach is within normal limits. Appendix appears
normal. No evidence of bowel wall thickening, distention, or
inflammatory changes. Occasional descending colonic diverticula.

Vascular/Lymphatic: Aortic atherosclerosis. No enlarged abdominal or
pelvic lymph nodes.

Reproductive: No mass or other significant abnormality.

Other: Small, fat containing umbilical hernia (series 2, image 53).
No abdominopelvic ascites.

Musculoskeletal: No acute or significant osseous findings.
IMPRESSION: 1. No CT findings of the abdomen or pelvis to explain left lower
quadrant pain.
2. Occasional descending colonic diverticula without evidence of
acute diverticulitis.
3. Hepatic steatosis.
4. Nonobstructive right nephrolithiasis.
5. Aortic Atherosclerosis ([id number]).

## 2022-08-12 DIAGNOSIS — E291 Testicular hypofunction: Secondary | ICD-10-CM | POA: Diagnosis not present

## 2022-11-18 DIAGNOSIS — E291 Testicular hypofunction: Secondary | ICD-10-CM | POA: Diagnosis not present

## 2022-11-18 DIAGNOSIS — I10 Essential (primary) hypertension: Secondary | ICD-10-CM | POA: Diagnosis not present

## 2022-11-18 DIAGNOSIS — Z125 Encounter for screening for malignant neoplasm of prostate: Secondary | ICD-10-CM | POA: Diagnosis not present

## 2022-11-18 DIAGNOSIS — E7800 Pure hypercholesterolemia, unspecified: Secondary | ICD-10-CM | POA: Diagnosis not present

## 2022-11-18 DIAGNOSIS — E119 Type 2 diabetes mellitus without complications: Secondary | ICD-10-CM | POA: Diagnosis not present

## 2022-12-02 DIAGNOSIS — E291 Testicular hypofunction: Secondary | ICD-10-CM | POA: Diagnosis not present

## 2022-12-29 DIAGNOSIS — E291 Testicular hypofunction: Secondary | ICD-10-CM | POA: Diagnosis not present

## 2023-01-28 DIAGNOSIS — R42 Dizziness and giddiness: Secondary | ICD-10-CM | POA: Diagnosis not present

## 2023-03-01 DIAGNOSIS — E291 Testicular hypofunction: Secondary | ICD-10-CM | POA: Diagnosis not present

## 2023-03-17 DIAGNOSIS — M25511 Pain in right shoulder: Secondary | ICD-10-CM | POA: Diagnosis not present

## 2023-03-23 DIAGNOSIS — E291 Testicular hypofunction: Secondary | ICD-10-CM | POA: Diagnosis not present

## 2023-03-24 DIAGNOSIS — Z008 Encounter for other general examination: Secondary | ICD-10-CM | POA: Diagnosis not present

## 2023-03-24 DIAGNOSIS — M25511 Pain in right shoulder: Secondary | ICD-10-CM | POA: Diagnosis not present

## 2023-05-05 DIAGNOSIS — E291 Testicular hypofunction: Secondary | ICD-10-CM | POA: Diagnosis not present

## 2023-05-23 DIAGNOSIS — E119 Type 2 diabetes mellitus without complications: Secondary | ICD-10-CM | POA: Diagnosis not present

## 2023-05-23 DIAGNOSIS — E291 Testicular hypofunction: Secondary | ICD-10-CM | POA: Diagnosis not present

## 2023-05-23 DIAGNOSIS — I700 Atherosclerosis of aorta: Secondary | ICD-10-CM | POA: Diagnosis not present

## 2023-05-23 DIAGNOSIS — E7800 Pure hypercholesterolemia, unspecified: Secondary | ICD-10-CM | POA: Diagnosis not present

## 2023-05-23 DIAGNOSIS — Z23 Encounter for immunization: Secondary | ICD-10-CM | POA: Diagnosis not present

## 2023-05-23 DIAGNOSIS — I10 Essential (primary) hypertension: Secondary | ICD-10-CM | POA: Diagnosis not present

## 2023-05-29 DIAGNOSIS — E119 Type 2 diabetes mellitus without complications: Secondary | ICD-10-CM | POA: Diagnosis not present

## 2023-06-05 DIAGNOSIS — E291 Testicular hypofunction: Secondary | ICD-10-CM | POA: Diagnosis not present

## 2023-07-11 DIAGNOSIS — R5383 Other fatigue: Secondary | ICD-10-CM | POA: Diagnosis not present

## 2023-07-11 DIAGNOSIS — R0981 Nasal congestion: Secondary | ICD-10-CM | POA: Diagnosis not present

## 2023-07-11 DIAGNOSIS — R0789 Other chest pain: Secondary | ICD-10-CM | POA: Diagnosis not present

## 2023-07-27 DIAGNOSIS — E663 Overweight: Secondary | ICD-10-CM | POA: Diagnosis not present

## 2023-07-27 DIAGNOSIS — E785 Hyperlipidemia, unspecified: Secondary | ICD-10-CM | POA: Diagnosis not present

## 2023-07-27 DIAGNOSIS — E1159 Type 2 diabetes mellitus with other circulatory complications: Secondary | ICD-10-CM | POA: Diagnosis not present

## 2023-07-27 DIAGNOSIS — I10 Essential (primary) hypertension: Secondary | ICD-10-CM | POA: Diagnosis not present

## 2023-07-27 DIAGNOSIS — Z008 Encounter for other general examination: Secondary | ICD-10-CM | POA: Diagnosis not present

## 2023-07-27 DIAGNOSIS — E1169 Type 2 diabetes mellitus with other specified complication: Secondary | ICD-10-CM | POA: Diagnosis not present

## 2023-07-27 DIAGNOSIS — Z6828 Body mass index (BMI) 28.0-28.9, adult: Secondary | ICD-10-CM | POA: Diagnosis not present

## 2023-08-08 DIAGNOSIS — J069 Acute upper respiratory infection, unspecified: Secondary | ICD-10-CM | POA: Diagnosis not present

## 2023-08-08 DIAGNOSIS — R051 Acute cough: Secondary | ICD-10-CM | POA: Diagnosis not present

## 2023-09-29 DIAGNOSIS — R5383 Other fatigue: Secondary | ICD-10-CM | POA: Diagnosis not present

## 2023-11-01 DIAGNOSIS — E291 Testicular hypofunction: Secondary | ICD-10-CM | POA: Diagnosis not present

## 2023-12-06 DIAGNOSIS — E291 Testicular hypofunction: Secondary | ICD-10-CM | POA: Diagnosis not present

## 2023-12-06 DIAGNOSIS — I10 Essential (primary) hypertension: Secondary | ICD-10-CM | POA: Diagnosis not present

## 2023-12-06 DIAGNOSIS — I499 Cardiac arrhythmia, unspecified: Secondary | ICD-10-CM | POA: Diagnosis not present

## 2023-12-06 DIAGNOSIS — D509 Iron deficiency anemia, unspecified: Secondary | ICD-10-CM | POA: Diagnosis not present

## 2023-12-06 DIAGNOSIS — R5383 Other fatigue: Secondary | ICD-10-CM | POA: Diagnosis not present

## 2023-12-06 DIAGNOSIS — E119 Type 2 diabetes mellitus without complications: Secondary | ICD-10-CM | POA: Diagnosis not present

## 2024-01-16 DIAGNOSIS — J019 Acute sinusitis, unspecified: Secondary | ICD-10-CM | POA: Diagnosis not present

## 2024-02-26 NOTE — Progress Notes (Unsigned)
 Cardiology Office Note Date:  02/29/2024  ID:  Devin Novak, DOB 10-28-1955, MRN [id number] PCP:  Arloa Elsie SAUNDERS, MD  Cardiologist: Joelle VEAR Ren Donley, MD  Chief Complaint  Patient presents with   Palpitations      Problems Abnormal ECG w/ bifascicular block TTE 04/2019- mild LVH and 70% Palpitations/dizziness HTN on AE5, and RL10 HLD on GE10, [id number]  Visits  09/18: TTE, 30-day event monitor, LP, A1C, TSH, and CAC score    History of Present Illness: Devin Novak is a 68 y.o. male who presents for new visit.   Over the last 6-7 months, he has been having worsening fatigue, especially when working in [employer]. He has to sit every so often for 5 minutes to rest when working. He denies any CP or dyspnea with exertion. He also denies orthopnea, PND, or LE edema. He does reports 1-2 short episodes of palpitations that last about 5 mins and occur randomly. He denies any pre-syncope or exertional component.   ROS: Please see the history of present illness. All other systems are reviewed and negative.   Past Medical History:  Diagnosis Date   Bell's palsy 11/16/2017   left   Diabetes mellitus without complication [facility])    ED (erectile dysfunction) 08/19/2016   Hyperlipidemia    Hypertension     Past Surgical History:  Procedure Laterality Date   COLONOSCOPY  Not sure    Current Outpatient Medications  Medication Sig Dispense Refill   amLODipine (NORVASC) 5 MG tablet Take 5 mg by mouth daily.     amlodipine-atorvastatin  (CADUET) 10-10 MG tablet Take 1 tablet by mouth daily.     aspirin -acetaminophen -caffeine  (EXCEDRIN  MIGRAINE) 250-250-65 MG tablet Take 1 tablet by mouth every 6 (six) hours as needed for headache. 20 tablet 0   atorvastatin  (LIPITOR) 40 MG tablet Take 1 tablet (40 mg total) by mouth daily. 90 tablet 3   azithromycin  (ZITHROMAX ) 250 MG tablet Take 2 pills by mouth on day 1, then 1 pill by mouth per day on days 2 through 5. 6  tablet 0   blood glucose meter kit and supplies KIT Dispense based on patient and insurance preference. Use up to four times daily as directed. (FOR ICD-9 250.00, 250.01). 1 each 0   glimepiride  (AMARYL ) 2 MG tablet TAKE ONE TABLET BY MOUTH ONCE DAILY BEFORE  BREAKFAST 90 tablet 3   glipiZIDE (GLUCOTROL) 10 MG tablet TAKE 1 TABLET BY MOUTH ONCE DAILY FOR 90 DAYS     hydrOXYzine  (ATARAX /VISTARIL ) 25 MG tablet Take 1 tablet (25 mg total) by mouth every 8 (eight) hours as needed for itching. 30 tablet 0   hyoscyamine (LEVSIN SL) 0.125 MG SL tablet DISSOLVE 1 TABLET UNDER THE TONGUE THREE TIMES DAILY AS NEEDED FOR ABDOMINAL CRAMPING     metFORMIN  (GLUCOPHAGE ) 1000 MG tablet Take 1 tablet (1,000 mg total) by mouth 2 (two) times daily with a meal. 180 tablet 3   pantoprazole (PROTONIX) 40 MG tablet TAKE 1 TABLET BY MOUTH ONCE DAILY FOR 30 DAYS     ramipril  (ALTACE ) 10 MG capsule Take 1 capsule (10 mg total) by mouth daily. 90 capsule 3   No current facility-administered medications for this visit.    Allergies:   Patient has no known allergies.   Social History:  No drinking, tobacco or substance use  Family History:  Nonpertinent  PHYSICAL EXAM: VS:  BP (!) 110/58 (BP Location: Left Arm, Patient Position: Sitting, Cuff Size: Normal)   Pulse 66  Ht 5' 11 (1.803 m)   Wt 214 lb (97.1 kg)   SpO2 98%   BMI 29.85 kg/m  , BMI Body mass index is 29.85 kg/m. GEN: Well nourished, well developed, in no acute distress HEENT: normal Neck: no JVD, carotid bruits, or masses Cardiac: RRR; no murmurs, rubs, or gallops,no edema  Respiratory:  CTAB bilaterally, normal work of breathing GI: soft, nontender, nondistended, + BS Extremities: No LE edema Skin: warm and dry, no rash Neuro:  Strength and sensation are intact  EKG: RBBB  Recent Labs: Reviewed  Studies: Reviewed  ASSESSMENT AND PLAN: Hasten Sweitzer is a 68 y.o. male who presents for new visit.    #Fatigue #Palpitations #HTN #HLD - Presenting with worsening fatigue and intermittent episodes of palpitations. My main concern is subclinical atrial fibrillation that causing his fatigue and thus will over 30 days event monitor, TTE. Will also add CAC to assess ASCVD burden. - Obtain TSH - Obtain A1C and LP to help optimize primary prevention - Follow up in 3 months   Signed, Joelle VEAR Ren Donley, MD  02/29/2024 8:43 AM    [facility]

## 2024-02-29 ENCOUNTER — Ambulatory Visit

## 2024-02-29 ENCOUNTER — Other Ambulatory Visit: Payer: Self-pay | Admitting: *Deleted

## 2024-02-29 VITALS — BP 110/58 | HR 66 | Ht 71.0 in | Wt 214.0 lb

## 2024-02-29 DIAGNOSIS — R42 Dizziness and giddiness: Secondary | ICD-10-CM

## 2024-02-29 DIAGNOSIS — R002 Palpitations: Secondary | ICD-10-CM

## 2024-02-29 DIAGNOSIS — E119 Type 2 diabetes mellitus without complications: Secondary | ICD-10-CM

## 2024-02-29 DIAGNOSIS — I4510 Unspecified right bundle-branch block: Secondary | ICD-10-CM

## 2024-02-29 DIAGNOSIS — E785 Hyperlipidemia, unspecified: Secondary | ICD-10-CM

## 2024-02-29 NOTE — Patient Instructions (Addendum)
 Medication Instructions:  Your physician recommends that you continue on your current medications as directed. Please refer to the Current Medication list given to you today.  *If you need a refill on your cardiac medications before your next appointment, please call your pharmacy*  Lab Work: Today: lipid profile, A1C & TSH  If you have any lab test that is abnormal or we need to change your treatment, we will call you to review the results.  Testing/Procedures: Your physician has recommended you have a calcium  score.   Your physician has requested that you have an echocardiogram. Echocardiography is a painless test that uses sound waves to create images of your heart. It provides your doctor with information about the size and shape of your heart and how well your heart's chambers and valves are working. This procedure takes approximately one hour. There are no restrictions for this procedure. Please do NOT wear cologne, perfume, aftershave, or lotions (deodorant is allowed). Please arrive 15 minutes prior to your appointment time.  Please note: We ask at that you not bring children with you during ultrasound (echo/ vascular) testing. Due to room size and safety concerns, children are not allowed in the ultrasound rooms during exams. Our front office staff cannot provide observation of children in our lobby area while testing is being conducted. An adult accompanying a patient to their appointment will only be allowed in the ultrasound room at the discretion of the ultrasound technician under special circumstances. We apologize for any inconvenience.   Your physician has recommended that you wear a 30 day event monitor. Event monitors are medical devices that record the heart's electrical activity. Doctors most often us  these monitors to diagnose arrhythmias. Arrhythmias are problems with the speed or rhythm of the heartbeat. The monitor is a small, portable device. You can wear one while you  do your normal daily activities. This is usually used to diagnose what is causing palpitations/syncope (passing out).   Follow-Up: At [facility], you and your health needs are our priority.  As part of our continuing mission to provide you with exceptional heart care, our providers are all part of one team.  This team includes your primary Cardiologist (physician) and Advanced Practice Providers or APPs (Physician Assistants and Nurse Practitioners) who all work together to provide you with the care you need, when you need it.  Your next appointment:   3 month(s)  Provider:   Dr. Azobou   Thank you for choosing [facility]!!   [phone]

## 2024-03-01 ENCOUNTER — Ambulatory Visit: Payer: Self-pay

## 2024-03-01 DIAGNOSIS — E119 Type 2 diabetes mellitus without complications: Secondary | ICD-10-CM

## 2024-03-01 DIAGNOSIS — R931 Abnormal findings on diagnostic imaging of heart and coronary circulation: Secondary | ICD-10-CM

## 2024-03-01 LAB — LIPID PANEL
Chol/HDL Ratio: 2.5 ratio (ref 0.0–5.0)
Cholesterol, Total: 144 mg/dL (ref 100–199)
HDL: 57 mg/dL (ref >39)
LDL Chol Calc (NIH): 68 mg/dL (ref 0–99)
Triglycerides: 104 mg/dL (ref 0–149)
VLDL Cholesterol Cal: 19 mg/dL (ref 5–40)

## 2024-03-01 LAB — TSH: TSH: 3.17 u[IU]/mL (ref 0.450–4.500)

## 2024-03-01 LAB — HEMOGLOBIN A1C
Est. average glucose Bld gHb Est-mCnc: 157 mg/dL
Hgb A1c MFr Bld: 7.1 % — ABNORMAL HIGH (ref 4.8–5.6)

## 2024-03-06 NOTE — Telephone Encounter (Signed)
 Pt returning call to discuss results he forgot which medication to stop or start

## 2024-03-06 NOTE — Progress Notes (Signed)
 Pt contacted and advised again of lab results. Pt agrees with plan of care.

## 2024-03-07 ENCOUNTER — Ambulatory Visit (HOSPITAL_COMMUNITY)
Admission: RE | Admit: 2024-03-07 | Discharge: 2024-03-07 | Disposition: A | Discharge status: Home / Self Care | Payer: Self-pay | Source: Ambulatory Visit

## 2024-03-07 DIAGNOSIS — E119 Type 2 diabetes mellitus without complications: Secondary | ICD-10-CM | POA: Diagnosis not present

## 2024-03-07 DIAGNOSIS — Z136 Encounter for screening for cardiovascular disorders: Secondary | ICD-10-CM | POA: Insufficient documentation

## 2024-03-07 DIAGNOSIS — E785 Hyperlipidemia, unspecified: Secondary | ICD-10-CM | POA: Insufficient documentation

## 2024-03-07 LAB — BASIC METABOLIC PANEL WITH GFR
BUN/Creatinine Ratio: 11 (ref 10–24)
BUN: 13 mg/dL (ref 8–27)
CO2: 20 mmol/L (ref 20–29)
Calcium: 10 mg/dL (ref 8.6–10.2)
Chloride: 102 mmol/L (ref 96–106)
Creatinine, Ser: 1.15 mg/dL (ref 0.76–1.27)
Glucose: 127 mg/dL — ABNORMAL HIGH (ref 70–99)
Potassium: 5 mmol/L (ref 3.5–5.2)
Sodium: 138 mmol/L (ref 134–144)
eGFR: 70 mL/min/1.73 (ref >59)

## 2024-03-12 DIAGNOSIS — Z008 Encounter for other general examination: Secondary | ICD-10-CM | POA: Diagnosis not present

## 2024-03-12 DIAGNOSIS — E1169 Type 2 diabetes mellitus with other specified complication: Secondary | ICD-10-CM | POA: Diagnosis not present

## 2024-03-12 DIAGNOSIS — E785 Hyperlipidemia, unspecified: Secondary | ICD-10-CM | POA: Diagnosis not present

## 2024-03-12 MED ORDER — METOPROLOL TARTRATE 100 MG PO TABS: 100.0000 mg | ORAL_TABLET | Freq: Once | ORAL | 0 refills | Status: DC

## 2024-03-12 NOTE — Telephone Encounter (Signed)
 Patient returned RN's call regarding results.

## 2024-03-12 NOTE — Telephone Encounter (Signed)
 The patient has been notified of the result and verbalized understanding.  All questions (if any) were answered.  Patient is aware he needs CCTA,  and appointment has been ordered for pharmacist - to discuss Weight loss / GLP-1 agonist / elevated A1C   Instruction verbal given to patient, written instruction mailed to patient . He is aware he will need  Metoprolol prior the CCTA  See letter the that was sent to patient  Patient also would like to come to the office to have assist to place 30 day monitor- will notify  monitor tech. Gladis Reena GAILS, RN 03/12/2024 3:18 PM       Your cardiac CT will be scheduled at the below location:       [facility] [address]  [place], [place] [address] [phone]      If scheduled at the Heart and Vascular Tower at [facility] street, please enter the parking lot using the [address] entrance and use the FREE valet service at the patient drop-off area. Enter the building and check-in with registration on the main floor.   Please follow these instructions carefully (unless otherwise directed):  An IV will be required for this test and Nitroglycerin will be given.   Hold all erectile dysfunction medications at least 3 days (72 hrs) prior to test. (Ie viagra, cialis, sildenafil, tadalafil, etc)   On the Night Before the Test: Be sure to Drink plenty of water. Do not consume any caffeinated/decaffeinated beverages or chocolate 12 hours prior to your test. Do not take any antihistamines 12 hours prior to your test.    On the Day of the Test: Drink plenty of water until 1 hour prior to the test. Do not eat any food 1 hour prior to test. You may take your regular medications prior to the test.  Take metoprolol (Lopressor) 100 mg  two hours prior to test. If you take Furosemide/Hydrochlorothiazide/Spironolactone/Chlorthalidone, please HOLD on the morning of the test.          After the Test: Drink  plenty of water. After receiving IV contrast, you may experience a mild flushed feeling. This is normal. On occasion, you may experience a mild rash up to 24 hours after the test. This is not dangerous. If this occurs, you can take Benadryl  25 mg, Zyrtec, Claritin, or Allegra and increase your fluid intake. (Patients taking Tikosyn should avoid Benadryl , and may take Zyrtec, Claritin, or Allegra) If you experience trouble breathing, this can be serious. If it is severe call 911 IMMEDIATELY. If it is mild, please call our office.  We will call to schedule your test 2-4 weeks out understanding that some insurance companies will need an authorization prior to the service being performed.   For more information and frequently asked questions, please visit our website : [email or web address]  For non-scheduling related questions, please contact the cardiac imaging nurse navigator should you have any questions/concerns: Cardiac Imaging Nurse Navigators Direct Office Dial: [phone]   For scheduling needs, including cancellations and rescheduling, please call [place], [phone].

## 2024-03-12 NOTE — Telephone Encounter (Signed)
-----   Message from Joelle VEAR Cedars Tonleu sent at 03/10/2024  3:19 PM EDT ----- Please let the patient that he had a lot of calcification in his major vessel (LAD) and we will need a coronary CTA to make sure there is no severe blockage. Joelle VEAR Cedars Donley, MD @TD  3:17 PM ----- Message ----- From: Interface, Rad Results In Sent: 03/07/2024   6:45 PM EDT To: Joelle VEAR Cedars Donley, MD

## 2024-03-13 ENCOUNTER — Ambulatory Visit

## 2024-03-13 DIAGNOSIS — R42 Dizziness and giddiness: Secondary | ICD-10-CM | POA: Diagnosis not present

## 2024-03-13 DIAGNOSIS — R002 Palpitations: Secondary | ICD-10-CM | POA: Diagnosis not present

## 2024-03-13 NOTE — Progress Notes (Unsigned)
 [employer] event monitor mailed to patient 02/29/24 and applied in office on 03/13/2024.

## 2024-03-19 ENCOUNTER — Telehealth (HOSPITAL_COMMUNITY): Payer: Self-pay | Admitting: Emergency Medicine

## 2024-03-19 NOTE — Telephone Encounter (Signed)
 Attempted to call patient regarding upcoming cardiac CT appointment. Left message on voicemail with name and callback number Rockwell Alexandria RN Navigator Cardiac Imaging [facility] [phone] Office [phone] Cell

## 2024-03-20 ENCOUNTER — Ambulatory Visit (HOSPITAL_COMMUNITY)
Admission: RE | Admit: 2024-03-20 | Discharge: 2024-03-20 | Disposition: A | Discharge status: Home / Self Care | Source: Ambulatory Visit

## 2024-03-20 DIAGNOSIS — E119 Type 2 diabetes mellitus without complications: Secondary | ICD-10-CM | POA: Diagnosis not present

## 2024-03-20 DIAGNOSIS — R931 Abnormal findings on diagnostic imaging of heart and coronary circulation: Secondary | ICD-10-CM | POA: Insufficient documentation

## 2024-03-20 DIAGNOSIS — I2510 Atherosclerotic heart disease of native coronary artery without angina pectoris: Secondary | ICD-10-CM | POA: Diagnosis not present

## 2024-03-20 MED ORDER — NITROGLYCERIN 0.4 MG SL SUBL
0.8000 mg | SUBLINGUAL_TABLET | Freq: Once | SUBLINGUAL | Status: AC
  Administered 2024-03-20: 0.8 mg via SUBLINGUAL

## 2024-03-20 MED ORDER — IOHEXOL 350 MG/ML SOLN
100.0000 mL | Freq: Once | INTRAVENOUS | Status: AC | PRN
  Administered 2024-03-20: 100 mL via INTRAVENOUS

## 2024-03-22 ENCOUNTER — Ambulatory Visit: Payer: Self-pay

## 2024-03-25 NOTE — Telephone Encounter (Signed)
 Pt calling for results

## 2024-03-28 ENCOUNTER — Telehealth: Payer: Self-pay

## 2024-03-28 NOTE — Telephone Encounter (Signed)
 Letter of results sent to pt

## 2024-03-28 NOTE — Telephone Encounter (Signed)
 Pt returning call for CT results. Please advise.

## 2024-03-28 NOTE — Telephone Encounter (Signed)
 Returned patient's call regarding CT w/ non-obstructive CAD. Patient verbalizes understanding that treatment going forward Will focus on lifestyle and pharmacotherapy.

## 2024-04-04 ENCOUNTER — Ambulatory Visit (HOSPITAL_COMMUNITY)
Admission: RE | Admit: 2024-04-04 | Discharge: 2024-04-04 | Disposition: A | Discharge status: Home / Self Care | Source: Ambulatory Visit | Attending: Internal Medicine | Admitting: Internal Medicine

## 2024-04-04 DIAGNOSIS — I4510 Unspecified right bundle-branch block: Secondary | ICD-10-CM | POA: Insufficient documentation

## 2024-04-04 DIAGNOSIS — R002 Palpitations: Secondary | ICD-10-CM | POA: Insufficient documentation

## 2024-04-04 LAB — ECHOCARDIOGRAM COMPLETE
Area-P 1/2: 5.09 cm2
S' Lateral: 2.6 cm

## 2024-04-09 ENCOUNTER — Telehealth: Payer: Self-pay

## 2024-04-09 ENCOUNTER — Other Ambulatory Visit (HOSPITAL_COMMUNITY): Payer: Self-pay

## 2024-04-09 ENCOUNTER — Ambulatory Visit: Attending: Internal Medicine

## 2024-04-09 VITALS — Ht 71.0 in | Wt 215.8 lb

## 2024-04-09 DIAGNOSIS — E119 Type 2 diabetes mellitus without complications: Secondary | ICD-10-CM | POA: Diagnosis not present

## 2024-04-09 NOTE — Telephone Encounter (Signed)
 Pharmacy Patient Advocate Encounter   Received notification from Physician's Office that prior authorization for JARDIANCE is required/requested.   Insurance verification completed.   The patient is insured through [employer].   Per test claim: The current 30 day co-pay is, $276.64.  No PA needed at this time. This test claim was processed through [facility]- copay amounts may vary at other pharmacies due to pharmacy/plan contracts, or as the patient moves through the different stages of their insurance plan.

## 2024-04-09 NOTE — Assessment & Plan Note (Signed)
 Assessment:  Most recent A1c was 7.1% on 02/2024, which is slightly above goal of <7% Tolerates metformin  and glipizide well without any side effects; however, would benefit from the addition of therapies that offer cardiovascular and renal protective effects. Discussed next potential options (SGLT2i and GLP-1 RA); cost, administration, dosing efficacy, side effects  Patient is not interested in injectable therapy at this time and willing to revisit in the future eGFR 70 on 02/2024 - appropriate to begin SGLT2i and patient willing to replace glipizide with SGLT2i   Plan: Continue taking metformin  1000 mg twice daily Will assess coverage for SGLT2i; will inform patient upon approval  Stop glipizide upon initiation of SGLT2i Discussed and provided Planning Healthy Meals handout and encouraged regular exercise Follow up BMP 2-3 weeks post SGLT2i initiation Follow up A1c 3 months post SGLT2i initiation

## 2024-04-09 NOTE — Progress Notes (Signed)
 Patient ID: Devin Novak                 DOB: 20-Nov-1955                    MRN: [id number]     HPI: Devin Novak is a 68 y.o. male patient referred to pharmacy clinic by Dr. Donley to initiate GLP1-RA therapy. PMH is significant for T2D, HLD, HTN and Bell's palsy, and obesity. Most recent weight/BMI 215.8 lbs and 30.1 kg/m. Most recent A1c 7.1% on 02/2024.  Patient presents today for PharmD visit to discuss potential additions to his DM regimen. Current DM regimen includes metformin  1000 mg twice daily and glipizide 10 mg daily. He reported continued use of glipizide, despite Dr. Sharee recommendation to discontinue it in September 2025 and switch to SGLT2i and GLP-1RA. He expressed discomfort with stopping the medication without starting an alternative therapy immediately.   We discussed glucose lowering therapies including GLP-1 RA therapy and SGLT2i and reviewed cardiovascular and kidney disease benefits. Patient is not interested in any injectable therapy at this time but willing to replace glipizide with SGLT2i.  Also, patient shared that his primary concern has been persistent fatigure rather than elevated A1c level. He was recently seen by Dr. Donley for fatigue. A CT scan revealed non-obstructive CAD and the current focus is on managing HLD and HTN for now. The patient mentioned having an ECHO last week but unsure of the result. He does not use the MyChart app and has been calling for updates with no response. Upon review, the ECHO showed normal EF, which provided him some relief. I recommended patient to sign up for MyChart to receive timely notifications, access test results and communicate concerns with the medical team.   Labs:  02/2024: eGFR 70, K 5.0, Scr 1.15 02/2024: A1c 7.1% (A1c goal <7%)  Diet: Patient states that he eats a lot of sub sandwiches and deli meat. He is aware that he needs to consume more vegetables and water; He is willing to make the necessary changes  to improve diet.   Exercise: Patient reports no exercise  Family History:  Relation Problem Comments  Mother (Deceased at age 87) Cancer (Age: 68) colon cancer  Diabetes     Father (Deceased at age 80)      Social History: Alcohol: 1-2 beers per day Smoking: none  Labs: Lab Results  Component Value Date   HGBA1C 7.1 (H) 02/29/2024    Wt Readings from Last 1 Encounters:  04/09/24 215 lb 12.8 oz (97.9 kg)    BP Readings from Last 1 Encounters:  03/20/24 (!) 146/81   Pulse Readings from Last 1 Encounters:  03/20/24 66       Component Value Date/Time   CHOL 144 02/29/2024 0942   TRIG 104 02/29/2024 0942   HDL 57 02/29/2024 0942   CHOLHDL 2.5 02/29/2024 0942   CHOLHDL 2.4 02/04/2016 0918   VLDL 10 02/04/2016 0918   LDLCALC 68 02/29/2024 0942    Past Medical History:  Diagnosis Date   Bell's palsy 11/16/2017   left   Diabetes mellitus without complication (HCC)    ED (erectile dysfunction) 08/19/2016   Hyperlipidemia    Hypertension     Current Outpatient Medications on File Prior to Visit  Medication Sig Dispense Refill   amLODipine (NORVASC) 5 MG tablet Take 5 mg by mouth daily.     aspirin -acetaminophen -caffeine  (EXCEDRIN  MIGRAINE) 250-250-65 MG tablet Take 1 tablet by mouth every 6 (six)  hours as needed for headache. 20 tablet 0   atorvastatin  (LIPITOR) 40 MG tablet Take 1 tablet (40 mg total) by mouth daily. 90 tablet 3   blood glucose meter kit and supplies KIT Dispense based on patient and insurance preference. Use up to four times daily as directed. (FOR ICD-9 250.00, 250.01). 1 each 0   glipiZIDE (GLUCOTROL) 10 MG tablet TAKE 1 TABLET BY MOUTH ONCE DAILY FOR 90 DAYS     hydrOXYzine  (ATARAX /VISTARIL ) 25 MG tablet Take 1 tablet (25 mg total) by mouth every 8 (eight) hours as needed for itching. 30 tablet 0   hyoscyamine (LEVSIN SL) 0.125 MG SL tablet DISSOLVE 1 TABLET UNDER THE TONGUE THREE TIMES DAILY AS NEEDED FOR ABDOMINAL CRAMPING     metFORMIN   (GLUCOPHAGE ) 1000 MG tablet Take 1 tablet (1,000 mg total) by mouth 2 (two) times daily with a meal. 180 tablet 3   metoprolol tartrate (LOPRESSOR) 100 MG tablet Take 1 tablet (100 mg total) by mouth once for 1 dose. TAKE TWO HOURS PRIOR TO  SCHEDULE CARDIAC TEST 1 tablet 0   pantoprazole (PROTONIX) 40 MG tablet TAKE 1 TABLET BY MOUTH ONCE DAILY FOR 30 DAYS     ramipril  (ALTACE ) 10 MG capsule Take 1 capsule (10 mg total) by mouth daily. 90 capsule 3   No current facility-administered medications on file prior to visit.    No Known Allergies  Diabetes mellitus type 2, uncomplicated  Assessment/Plan:  Assessment:  Most recent A1c was 7.1% on 02/2024, which is slightly above goal of <7% Tolerates metformin  and glipizide well without any side effects; however, would benefit from the addition of therapies that offer cardiovascular and renal protective effects. Discussed next potential options (SGLT2i and GLP-1 RA); cost, administration, dosing efficacy, side effects  Patient is not interested in injectable therapy at this time and willing to revisit in the future eGFR 70 on 02/2024 - appropriate to begin SGLT2i and patient willing to replace glipizide with SGLT2i   Plan: Continue taking metformin  1000 mg twice daily Will assess coverage for SGLT2i; will inform patient upon approval  Stop glipizide upon initiation of SGLT2i Discussed and provided Planning Healthy Meals handout and encouraged regular exercise Follow up BMP 2-3 weeks post SGLT2i initiation Follow up A1c 3 months post SGLT2i initiation   [name], Pharm.D [name] [address], [place], [place] [address] Phone: [phone]; Fax: [phone]

## 2024-04-09 NOTE — Patient Instructions (Addendum)
 Changes made by your pharmacist, Dorsey Pizza  Will reach out to you once I hear back from insurance regarding Jardiance or Farxiga Continue metformin  1000 mg twice daily  Stop glipizide 10 mg daily Implement lifestyle modifications Recheck kidney function 2-3 weeks after starting new medication Recheck A1c in 3 months after starting new medication  Jadien Lehigh E. Anan Dapolito, Pharm.D Arnold [name]. [facility] [address] 5th [address], [place], [place] [address] Phone: [phone]; Fax: [phone]    [facility] locations: [facility] - [address] First floor - [address] ([facility])  - [address] [phone]    [employer] office - [address] [facility] - [address]   [place]  -[address] Third floor ([facility]) [phone] [address]    [place] - [address]  - [address] [place]  - [address] [place] - [address] Chief Technology Officer)    Tips for living a healthier life     Building a Chief Of Staff Diet Make most of your meal vegetables and fruits -  of your plate. Aim for color and variety, and remember that potatoes don't count as vegetables on the Healthy Eating Plate because of their negative impact on blood sugar.  Go for whole grains -  of your plate. Whole and intact grains--whole wheat, barley, wheat berries, quinoa, oats, brown rice, and foods made with them, such as whole wheat pasta--have a milder effect on blood sugar and insulin  than Clancy Leiner bread, Ginna Schuur rice, and other refined grains.  Protein power -  of your plate. Fish, poultry, beans, and nuts are all healthy, versatile protein sources--they can be mixed into salads, and pair well with vegetables on a plate. Limit red meat, and avoid processed meats such as bacon and sausage.  Healthy plant  oils - in moderation. Choose healthy vegetable oils like olive, canola, soy, corn, sunflower, peanut, and others, and avoid partially hydrogenated oils, which contain unhealthy trans fats. Remember that low-fat does not mean "healthy."  Drink water, coffee, or tea. Skip sugary drinks, limit milk and dairy products to one to two servings per day, and limit juice to a small glass per day.  Stay active. The red figure running across the Healthy Eating Plate's placemat is a reminder that staying active is also important in weight control.  The main message of the Healthy Eating Plate is to focus on diet quality:  The type of carbohydrate in the diet is more important than the amount of carbohydrate in the diet, because some sources of carbohydrate--like vegetables (other than potatoes), fruits, whole grains, and beans--are healthier than others. The Healthy Eating Plate also advises consumers to avoid sugary beverages, a major source of calories--usually with little nutritional value--in the American diet. The Healthy Eating Plate encourages consumers to use healthy oils, and it does not set a maximum on the percentage of calories people should get each day from healthy sources of fat. In this way, the Healthy Eating Plate recommends the opposite of the low-fat message promoted for decades by the USDA.  cuetune.com.ee  SUGAR  Sugar is a huge problem in the modern day diet. Sugar is a big contributor to heart disease, diabetes, high triglyceride levels, fatty liver disease and obesity. Sugar is hidden in almost all  packaged foods/beverages. Added sugar is extra sugar that is added beyond what is naturally found and has no nutritional benefit for your body. The American Heart Association recommends limiting added sugars to no more than 25g for women and 36 grams for men per day. There are many names for sugar including maltose, sucrose (names ending in  ose), high fructose corn syrup, molasses, cane sugar, corn sweetener, raw sugar, syrup, honey or fruit juice concentrate.   One of the best ways to limit your added sugars is to stop drinking sweetened beverages such as soda, sweet tea, and fruit juice.  There is 65g of added sugars in one 20oz bottle of Coke! That is equal to 7.5 donuts.   Pay attention and read all nutrition facts labels. Below is an examples of a nutrition facts label. The #1 is showing you the total sugars where the # 2 is showing you the added sugars. This one serving has almost the max amount of added sugars per day!   EXERCISE  Exercise is good. We've all heard that. In an ideal world, we would all have time and resources to get plenty of it. When you are active, your heart pumps more efficiently and you will feel better.  Multiple studies show that even walking regularly has benefits that include living a longer life. The American Heart Association recommends 150 minutes per week of exercise (30 minutes per day most days of the week). You can do this in any increment you wish. Nine or more 10-minute walks count. So does an hour-long exercise class. Break the time apart into what will work in your life. Some of the best things you can do include walking briskly, jogging, cycling or swimming laps. Not everyone is ready to "exercise." Sometimes we need to start with just getting active. Here are some easy ways to be more active throughout the day:  Take the stairs instead of the elevator  Go for a 10-15 minute walk during your lunch break (find a friend to make it more enjoyable)  When shopping, park at the back of the parking lot  If you take public transportation, get off one stop early and walk the extra distance  Pace around while making phone calls  Check with your doctor if you aren't sure what your limitations may be. Always remember to drink plenty of water when doing any type of exercise. Don't feel like a failure  if you're not getting the 90-150 minutes per week. If you started by being a couch potato, then just a 10-minute walk each day is a huge improvement. Start with little victories and work your way up.   HEALTHY EATING TIPS              Plan ahead: make a menu of the meals for a week then create a grocery list to go with that menu. Consider meals that easily stretch into a night of leftovers, such as stews or casseroles. Or consider making two of your favorite meal and put one in the freezer for another night. Try a night or two each week that is "meatless" or "no cook" such as salads. When you get home from the grocery store wash and prepare your vegetables and fruits. Then when you need them they are ready to go.   Tips for going to the grocery store:  [place] store or generic brands  Check the weekly ad from your store on-line or in their in-store flyer  Look at the unit  price on the shelf tag to compare/contrast the costs of different items  Buy fruits/vegetables in season  Carrots, bananas and apples are low-cost, naturally healthy items  If meats or frozen vegetables are on sale, buy some extras and put in your freezer  Limit buying prepared or "ready to eat" items, even if they are pre-made salads or fruit snacks  Do not shop when you're hungry  Foods at eye level tend to be more expensive. Look on the high and low shelves for deals.  Consider shopping at the farmer's market for fresh foods in season.  Avoid the cookie and chip aisles (these are expensive, high in calories and low in nutritional value). Shop on the outside of the grocery store.  Healthy food preparations:  If you can't get lean hamburger, be sure to drain the fat when cooking  Steam, saut (in olive oil), grill or bake foods  Experiment with different seasonings to avoid adding salt to your foods. Kosher salt, sea salt and Himalayan salt are all still salt and should be avoided. Try seasoning food with onion, garlic, thyme,  rosemary, basil ect. Onion powder or garlic powder is ok. Avoid if it says salt (ie garlic salt).

## 2024-04-11 ENCOUNTER — Telehealth: Payer: Self-pay | Admitting: Pharmacy Technician

## 2024-04-11 ENCOUNTER — Other Ambulatory Visit (HOSPITAL_COMMUNITY): Payer: Self-pay

## 2024-04-11 NOTE — Telephone Encounter (Signed)
 Ran test claim for farxiga. For a 30 day supply and the co-pay is 269.71 . PA is not needed at this time.   Insurance wouldn't pay for generic farxiga nor invokana-they said drug excluded  He doesn't have cardiomyopathy nor heart failure so we can not get him a healthwell grant  We could try to see if he would get approved for patient assistance with jardiance or farxiga if he would like.   Doreen would be cheaper after his deductible is met  For farxiga:  For jardiance:

## 2024-04-11 NOTE — Telephone Encounter (Signed)
 Patient called me back and would like to proceed with assessing eligibility for patient assistance for SGLT2i. Can we proceed with this? Patient is aware we will be reaching out for additional info. Thank you.

## 2024-04-11 NOTE — Telephone Encounter (Signed)
 Called patient x2 to discuss the copay for SGLT2i therapy and to assess interest in exploring eligibility for patient assistance or reconsidering GLP-1 RA therapy. No response; left voicemail requesting a call back.

## 2024-04-11 NOTE — Telephone Encounter (Signed)
 Send jardiance 10mg  daily application to patient

## 2024-04-19 NOTE — Telephone Encounter (Signed)
 Spoke to patient regarding patient assistance application. He has received the application and is working on completing. He will mail application or stop by the office once completed.

## 2024-04-23 DIAGNOSIS — R002 Palpitations: Secondary | ICD-10-CM

## 2024-04-23 DIAGNOSIS — R42 Dizziness and giddiness: Secondary | ICD-10-CM

## 2024-04-26 NOTE — Telephone Encounter (Signed)
 I called and spoke with the patient and offered for him to come in and I will help him with the application. He said he will try to work on this weekend

## 2024-05-03 NOTE — Telephone Encounter (Signed)
 I called the patient and he is still working on the application and he will try to come in next week to do the application.

## 2024-05-13 ENCOUNTER — Other Ambulatory Visit (HOSPITAL_COMMUNITY): Payer: Self-pay

## 2024-05-13 NOTE — Telephone Encounter (Signed)
 I called the patient and left a message to follow up on application

## 2024-05-20 NOTE — Telephone Encounter (Signed)
 I called the patient and had to lmom

## 2024-05-28 NOTE — Progress Notes (Unsigned)
°   °  °  Cardiology Office Note Date:  05/30/2024  ID:  [name], DOB 04-Oct-1955, MRN [id number] PCP:  Arloa Elsie SAUNDERS, MD  Cardiologist:   Joelle VEAR Ren Donley, MD  Chief Complaint  Patient presents with   Coronary Artery Disease      Problems Abnormal ECG w/ bifascicular block TTE 04/2019- mild LVH and 70% CAD CAC 9/25: 719 (470/141/108) CCCTA 10/25: 863 (96th)- Non-obstructive CAD Palpitations/dizziness EM 10/25 w/o Afib TTE 10/25 65-70% M: AN40, RL10, EN10 L: HA1C 7.1 9/25, LDL 68   Visits  09/25: TTE, 30-day event monitor, LP, A1C, TSH, and CAC score --> d/c glipizde and PharmD for SGLT2i-> did not fill out form for financial assistance  12/25: ASA81, BP cuff/log, follow up 6 months with APP for possible stress test    History of Present Illness: Discussed the use of AI scribe software for clinical note transcription with the patient, who gave verbal consent to proceed.  Devin Novak is a 68 year old male with coronary artery disease who presents with fatigue. He has had significant fatigue for 8 months with variable severity, starting after an episode of flu last year. He has no chest pain or shortness of breath. He occasionally checks his blood pressure at [facility] but does not have a home blood pressure cuff. A prior CT scan showed a high coronary plaque burden at the 96th percentile for his age and gender, with noted calcium  and plaque in his arteries. A previous heart monitor showed no abnormalities, and he has had a prior stress test. He works in [employer] and does not smoke. He is currently working on the financial form for empagliflozin.   ROS: Otherwise negative  Physical Exam VS:  BP (!) 114/50   Pulse 60   Ht 5' 11 (1.803 m)   Wt 210 lb 12.8 oz (95.6 kg)   BMI 29.40 kg/m  , BMI Body mass index is 29.4 kg/m. GEN: Well nourished, well developed, in no acute distress HEENT: normal Neck: no JVD, carotid bruits, or masses Cardiac: RRR; no  murmurs, rubs, or gallops,no edema  Respiratory:  CTAB bilaterally, normal work of breathing GI: soft, nontender, nondistended, + BS Extremities: No LE edema Skin: warm and dry, no rash Neuro:  Strength and sensation are intact  Recent Labs: Reviewed  ASSESSMENT AND PLAN [name] is a 68 y.o. male who presents for follow up.     Coronary artery disease with extensive coronary artery calcification Extensive coronary artery calcification with plaque at the 96th percentile for age and gender, indicating high risk for myocardial infarction. No significant blockages on CT scan. Fatigue may be related to coronary artery disease, but no definitive cardiac cause identified. - Started aspirin  81 mg daily. - Monitor blood pressure at home daily for two weeks, then weekly. - Provided prescription for blood pressure cuff and log. - Lipid panel in 6 months. - Will reassess in six months to evaluate fatigue and consider stress test.  Hypertension Blood pressure well-controlled in office, but home monitoring is necessary to ensure consistent control. - Provided prescription for blood pressure cuff. - Instructed to monitor blood pressure at home daily for two weeks, then weekly. - Provided blood pressure log for home monitoring.    Signed, Joelle VEAR Ren Donley, MD  05/30/2024 8:02 AM     [facility]

## 2024-05-30 ENCOUNTER — Ambulatory Visit

## 2024-05-30 ENCOUNTER — Other Ambulatory Visit (HOSPITAL_COMMUNITY): Payer: Self-pay

## 2024-05-30 VITALS — BP 114/50 | HR 60 | Ht 71.0 in | Wt 210.8 lb

## 2024-05-30 DIAGNOSIS — I10 Essential (primary) hypertension: Secondary | ICD-10-CM

## 2024-05-30 DIAGNOSIS — R002 Palpitations: Secondary | ICD-10-CM | POA: Diagnosis not present

## 2024-05-30 DIAGNOSIS — E785 Hyperlipidemia, unspecified: Secondary | ICD-10-CM | POA: Diagnosis not present

## 2024-05-30 DIAGNOSIS — R931 Abnormal findings on diagnostic imaging of heart and coronary circulation: Secondary | ICD-10-CM

## 2024-05-30 DIAGNOSIS — E119 Type 2 diabetes mellitus without complications: Secondary | ICD-10-CM

## 2024-05-30 MED ORDER — ASPIRIN 81 MG PO TBEC: 81.0000 mg | DELAYED_RELEASE_TABLET | Freq: Every day | ORAL | Status: AC

## 2024-05-30 NOTE — Patient Instructions (Signed)
 Medication Instructions:   START ASPIRIN  81 NG ONCE DAILY  *If you need a refill on your cardiac medications before your next appointment, please call your pharmacy*   Follow-Up: At [facility], you and your health needs are our priority.  As part of our continuing mission to provide you with exceptional heart care, our providers are all part of one team.  This team includes your primary Cardiologist (physician) and Advanced Practice Providers or APPs (Physician Assistants and Nurse Practitioners) who all work together to provide you with the care you need, when you need it.  Your next appointment:   6 month(s)  Provider:   One of our Advanced Practice Providers (APPs): Morse Clause, PA-C  Lamarr Satterfield, NP Miriam Shams, NP  Olivia Pavy, PA-C Josefa Beauvais, NP  Leontine Salen, PA-C Orren Fabry, PA-C  [place], PA-C Ernest Dick, NP  Damien Braver, NP Jon Hails, PA-C  Waddell Donath, PA-C    Dayna Dunn, PA-C  Scott Weaver, PA-C Lum Louis, NP Katlyn West, NP Callie Goodrich, PA-C  Xika Zhao, NP Sheng Haley, PA-C    Kathleen Johnson, PA-C
